# Patient Record
Sex: Male | Born: 1960 | Race: Black or African American | Hispanic: No | Marital: Single | Smoking: Current every day smoker
Health system: Southern US, Community
[De-identification: ages and names within clinical notes are randomized; demographics above are authoritative.]

## PROBLEM LIST (undated history)

## (undated) DIAGNOSIS — M1990 Unspecified osteoarthritis, unspecified site: Secondary | ICD-10-CM

## (undated) DIAGNOSIS — K219 Gastro-esophageal reflux disease without esophagitis: Secondary | ICD-10-CM

## (undated) DIAGNOSIS — F1010 Alcohol abuse, uncomplicated: Secondary | ICD-10-CM

## (undated) DIAGNOSIS — I10 Essential (primary) hypertension: Secondary | ICD-10-CM

## (undated) HISTORY — DX: Essential (primary) hypertension: I10

## (undated) HISTORY — DX: Alcohol abuse, uncomplicated: F10.10

## (undated) HISTORY — PX: BACK SURGERY: SHX140

## (undated) HISTORY — PX: HIP FRACTURE SURGERY: SHX118

## (undated) HISTORY — PX: SKIN GRAFT: SHX250

## (undated) HISTORY — DX: Gastro-esophageal reflux disease without esophagitis: K21.9

## (undated) HISTORY — DX: Unspecified osteoarthritis, unspecified site: M19.90

---

## 2004-05-19 ENCOUNTER — Emergency Department: Payer: Self-pay | Admitting: Emergency Medicine

## 2005-08-19 ENCOUNTER — Emergency Department: Payer: Self-pay | Admitting: Unknown Physician Specialty

## 2009-06-20 DIAGNOSIS — S72009A Fracture of unspecified part of neck of unspecified femur, initial encounter for closed fracture: Secondary | ICD-10-CM | POA: Insufficient documentation

## 2010-04-17 ENCOUNTER — Emergency Department: Payer: Self-pay | Admitting: Emergency Medicine

## 2011-02-02 ENCOUNTER — Emergency Department: Payer: Self-pay | Admitting: Emergency Medicine

## 2011-03-08 ENCOUNTER — Emergency Department: Payer: Self-pay | Admitting: Unknown Physician Specialty

## 2011-10-16 ENCOUNTER — Emergency Department: Payer: Self-pay | Admitting: Emergency Medicine

## 2011-12-15 ENCOUNTER — Emergency Department: Payer: Self-pay | Admitting: Emergency Medicine

## 2011-12-15 DIAGNOSIS — F17200 Nicotine dependence, unspecified, uncomplicated: Secondary | ICD-10-CM | POA: Diagnosis not present

## 2011-12-15 DIAGNOSIS — Z79899 Other long term (current) drug therapy: Secondary | ICD-10-CM | POA: Diagnosis not present

## 2011-12-15 DIAGNOSIS — L298 Other pruritus: Secondary | ICD-10-CM | POA: Diagnosis not present

## 2011-12-15 DIAGNOSIS — Z8614 Personal history of Methicillin resistant Staphylococcus aureus infection: Secondary | ICD-10-CM | POA: Diagnosis not present

## 2011-12-15 DIAGNOSIS — L290 Pruritus ani: Secondary | ICD-10-CM | POA: Diagnosis not present

## 2012-03-08 ENCOUNTER — Emergency Department: Payer: Self-pay | Admitting: Emergency Medicine

## 2012-03-08 DIAGNOSIS — L02219 Cutaneous abscess of trunk, unspecified: Secondary | ICD-10-CM | POA: Diagnosis not present

## 2012-03-08 DIAGNOSIS — Z79899 Other long term (current) drug therapy: Secondary | ICD-10-CM | POA: Diagnosis not present

## 2012-03-08 DIAGNOSIS — L02419 Cutaneous abscess of limb, unspecified: Secondary | ICD-10-CM | POA: Diagnosis not present

## 2012-03-08 DIAGNOSIS — K219 Gastro-esophageal reflux disease without esophagitis: Secondary | ICD-10-CM | POA: Diagnosis not present

## 2012-03-08 DIAGNOSIS — L03119 Cellulitis of unspecified part of limb: Secondary | ICD-10-CM | POA: Diagnosis not present

## 2012-03-08 DIAGNOSIS — Z8614 Personal history of Methicillin resistant Staphylococcus aureus infection: Secondary | ICD-10-CM | POA: Diagnosis not present

## 2012-03-10 ENCOUNTER — Emergency Department: Payer: Self-pay | Admitting: Internal Medicine

## 2012-03-10 DIAGNOSIS — Z79899 Other long term (current) drug therapy: Secondary | ICD-10-CM | POA: Diagnosis not present

## 2012-03-10 DIAGNOSIS — Z8614 Personal history of Methicillin resistant Staphylococcus aureus infection: Secondary | ICD-10-CM | POA: Diagnosis not present

## 2012-03-10 DIAGNOSIS — Z09 Encounter for follow-up examination after completed treatment for conditions other than malignant neoplasm: Secondary | ICD-10-CM | POA: Diagnosis not present

## 2012-03-11 DIAGNOSIS — M129 Arthropathy, unspecified: Secondary | ICD-10-CM | POA: Diagnosis not present

## 2012-03-11 DIAGNOSIS — J4489 Other specified chronic obstructive pulmonary disease: Secondary | ICD-10-CM | POA: Diagnosis not present

## 2012-03-11 DIAGNOSIS — R5383 Other fatigue: Secondary | ICD-10-CM | POA: Diagnosis not present

## 2012-03-11 DIAGNOSIS — E7800 Pure hypercholesterolemia, unspecified: Secondary | ICD-10-CM | POA: Diagnosis not present

## 2012-03-11 DIAGNOSIS — R109 Unspecified abdominal pain: Secondary | ICD-10-CM | POA: Diagnosis not present

## 2012-03-11 DIAGNOSIS — J449 Chronic obstructive pulmonary disease, unspecified: Secondary | ICD-10-CM | POA: Diagnosis not present

## 2012-03-11 DIAGNOSIS — R5381 Other malaise: Secondary | ICD-10-CM | POA: Diagnosis not present

## 2012-03-12 LAB — WOUND CULTURE

## 2012-03-17 DIAGNOSIS — F339 Major depressive disorder, recurrent, unspecified: Secondary | ICD-10-CM | POA: Diagnosis not present

## 2012-03-17 DIAGNOSIS — S069X9A Unspecified intracranial injury with loss of consciousness of unspecified duration, initial encounter: Secondary | ICD-10-CM | POA: Diagnosis not present

## 2012-03-17 DIAGNOSIS — M545 Low back pain: Secondary | ICD-10-CM | POA: Diagnosis not present

## 2012-03-17 DIAGNOSIS — R269 Unspecified abnormalities of gait and mobility: Secondary | ICD-10-CM | POA: Diagnosis not present

## 2012-03-24 DIAGNOSIS — L0233 Carbuncle of buttock: Secondary | ICD-10-CM | POA: Diagnosis not present

## 2012-06-04 DIAGNOSIS — M545 Low back pain: Secondary | ICD-10-CM | POA: Diagnosis not present

## 2012-06-04 DIAGNOSIS — IMO0001 Reserved for inherently not codable concepts without codable children: Secondary | ICD-10-CM | POA: Diagnosis not present

## 2012-07-21 DIAGNOSIS — M62838 Other muscle spasm: Secondary | ICD-10-CM | POA: Diagnosis not present

## 2012-07-21 DIAGNOSIS — M549 Dorsalgia, unspecified: Secondary | ICD-10-CM | POA: Diagnosis not present

## 2012-08-31 DIAGNOSIS — M545 Low back pain: Secondary | ICD-10-CM | POA: Diagnosis not present

## 2012-08-31 DIAGNOSIS — Z5189 Encounter for other specified aftercare: Secondary | ICD-10-CM | POA: Diagnosis not present

## 2012-08-31 DIAGNOSIS — F329 Major depressive disorder, single episode, unspecified: Secondary | ICD-10-CM | POA: Diagnosis not present

## 2012-09-13 DIAGNOSIS — Z79899 Other long term (current) drug therapy: Secondary | ICD-10-CM | POA: Diagnosis not present

## 2012-09-13 DIAGNOSIS — J449 Chronic obstructive pulmonary disease, unspecified: Secondary | ICD-10-CM | POA: Diagnosis not present

## 2012-09-13 DIAGNOSIS — R5383 Other fatigue: Secondary | ICD-10-CM | POA: Diagnosis not present

## 2012-09-13 DIAGNOSIS — R5381 Other malaise: Secondary | ICD-10-CM | POA: Diagnosis not present

## 2012-09-13 DIAGNOSIS — F329 Major depressive disorder, single episode, unspecified: Secondary | ICD-10-CM | POA: Diagnosis not present

## 2012-09-13 DIAGNOSIS — E7800 Pure hypercholesterolemia, unspecified: Secondary | ICD-10-CM | POA: Diagnosis not present

## 2012-09-13 DIAGNOSIS — Z0000 Encounter for general adult medical examination without abnormal findings: Secondary | ICD-10-CM | POA: Diagnosis not present

## 2012-09-13 DIAGNOSIS — E559 Vitamin D deficiency, unspecified: Secondary | ICD-10-CM | POA: Diagnosis not present

## 2012-10-11 DIAGNOSIS — F17200 Nicotine dependence, unspecified, uncomplicated: Secondary | ICD-10-CM | POA: Diagnosis not present

## 2012-10-11 DIAGNOSIS — L0291 Cutaneous abscess, unspecified: Secondary | ICD-10-CM | POA: Diagnosis not present

## 2012-10-11 DIAGNOSIS — L0390 Cellulitis, unspecified: Secondary | ICD-10-CM | POA: Diagnosis not present

## 2012-10-11 DIAGNOSIS — F411 Generalized anxiety disorder: Secondary | ICD-10-CM | POA: Diagnosis not present

## 2012-10-11 DIAGNOSIS — F329 Major depressive disorder, single episode, unspecified: Secondary | ICD-10-CM | POA: Diagnosis not present

## 2012-10-11 DIAGNOSIS — J449 Chronic obstructive pulmonary disease, unspecified: Secondary | ICD-10-CM | POA: Diagnosis not present

## 2012-12-17 DIAGNOSIS — J449 Chronic obstructive pulmonary disease, unspecified: Secondary | ICD-10-CM | POA: Diagnosis not present

## 2012-12-17 DIAGNOSIS — M545 Low back pain: Secondary | ICD-10-CM | POA: Diagnosis not present

## 2012-12-17 DIAGNOSIS — R5383 Other fatigue: Secondary | ICD-10-CM | POA: Diagnosis not present

## 2012-12-17 DIAGNOSIS — Z79899 Other long term (current) drug therapy: Secondary | ICD-10-CM | POA: Diagnosis not present

## 2012-12-17 DIAGNOSIS — F411 Generalized anxiety disorder: Secondary | ICD-10-CM | POA: Diagnosis not present

## 2012-12-17 DIAGNOSIS — F329 Major depressive disorder, single episode, unspecified: Secondary | ICD-10-CM | POA: Diagnosis not present

## 2012-12-17 DIAGNOSIS — E7800 Pure hypercholesterolemia, unspecified: Secondary | ICD-10-CM | POA: Diagnosis not present

## 2012-12-17 DIAGNOSIS — E559 Vitamin D deficiency, unspecified: Secondary | ICD-10-CM | POA: Diagnosis not present

## 2012-12-17 DIAGNOSIS — F17200 Nicotine dependence, unspecified, uncomplicated: Secondary | ICD-10-CM | POA: Diagnosis not present

## 2012-12-29 DIAGNOSIS — Z5189 Encounter for other specified aftercare: Secondary | ICD-10-CM | POA: Diagnosis not present

## 2012-12-29 DIAGNOSIS — M545 Low back pain: Secondary | ICD-10-CM | POA: Diagnosis not present

## 2013-02-19 ENCOUNTER — Emergency Department: Payer: Self-pay | Admitting: Emergency Medicine

## 2013-02-19 LAB — DRUG SCREEN, URINE
Amphetamines, Ur Screen: NEGATIVE (ref ?–1000)
Barbiturates, Ur Screen: NEGATIVE (ref ?–200)
Cannabinoid 50 Ng, Ur ~~LOC~~: POSITIVE (ref ?–50)
Cocaine Metabolite,Ur ~~LOC~~: POSITIVE (ref ?–300)
MDMA (Ecstasy)Ur Screen: NEGATIVE (ref ?–500)
Opiate, Ur Screen: NEGATIVE (ref ?–300)

## 2013-02-19 LAB — CBC
HGB: 15.3 g/dL (ref 13.0–18.0)
MCHC: 34.1 g/dL (ref 32.0–36.0)
MCV: 96 fL (ref 80–100)
Platelet: 324 10*3/uL (ref 150–440)
RBC: 4.66 10*6/uL (ref 4.40–5.90)
RDW: 14.3 % (ref 11.5–14.5)
WBC: 12.1 10*3/uL — ABNORMAL HIGH (ref 3.8–10.6)

## 2013-02-19 LAB — COMPREHENSIVE METABOLIC PANEL
Albumin: 4.6 g/dL (ref 3.4–5.0)
Anion Gap: 10 (ref 7–16)
BUN: 13 mg/dL (ref 7–18)
Bilirubin,Total: 0.6 mg/dL (ref 0.2–1.0)
Calcium, Total: 9.9 mg/dL (ref 8.5–10.1)
Chloride: 106 mmol/L (ref 98–107)
EGFR (Non-African Amer.): >60
Glucose: 119 mg/dL — ABNORMAL HIGH (ref 65–99)
SGOT(AST): 47 U/L — ABNORMAL HIGH (ref 15–37)
Sodium: 139 mmol/L (ref 136–145)

## 2013-02-19 LAB — SALICYLATE LEVEL: Salicylates, Serum: 3.4 mg/dL — ABNORMAL HIGH

## 2013-02-19 LAB — TSH: Thyroid Stimulating Horm: 0.95 u[IU]/mL

## 2013-02-19 LAB — ETHANOL
Ethanol %: <0.003 % (ref 0.000–0.080)
Ethanol: <3 mg/dL

## 2013-02-19 LAB — TROPONIN I: Troponin-I: <0.02 ng/mL

## 2013-03-25 DIAGNOSIS — F17200 Nicotine dependence, unspecified, uncomplicated: Secondary | ICD-10-CM | POA: Diagnosis not present

## 2013-03-25 DIAGNOSIS — L0291 Cutaneous abscess, unspecified: Secondary | ICD-10-CM | POA: Diagnosis not present

## 2013-03-25 DIAGNOSIS — E7800 Pure hypercholesterolemia, unspecified: Secondary | ICD-10-CM | POA: Diagnosis not present

## 2013-03-25 DIAGNOSIS — R5381 Other malaise: Secondary | ICD-10-CM | POA: Diagnosis not present

## 2013-03-25 DIAGNOSIS — M545 Low back pain, unspecified: Secondary | ICD-10-CM | POA: Diagnosis not present

## 2013-03-25 DIAGNOSIS — R109 Unspecified abdominal pain: Secondary | ICD-10-CM | POA: Diagnosis not present

## 2013-03-25 DIAGNOSIS — F411 Generalized anxiety disorder: Secondary | ICD-10-CM | POA: Diagnosis not present

## 2013-03-25 DIAGNOSIS — Z79899 Other long term (current) drug therapy: Secondary | ICD-10-CM | POA: Diagnosis not present

## 2013-03-25 DIAGNOSIS — E559 Vitamin D deficiency, unspecified: Secondary | ICD-10-CM | POA: Diagnosis not present

## 2013-03-25 DIAGNOSIS — G541 Lumbosacral plexus disorders: Secondary | ICD-10-CM | POA: Diagnosis not present

## 2013-03-25 DIAGNOSIS — J449 Chronic obstructive pulmonary disease, unspecified: Secondary | ICD-10-CM | POA: Diagnosis not present

## 2013-04-06 ENCOUNTER — Emergency Department: Payer: Self-pay | Admitting: Emergency Medicine

## 2013-04-06 DIAGNOSIS — F17200 Nicotine dependence, unspecified, uncomplicated: Secondary | ICD-10-CM | POA: Diagnosis not present

## 2013-04-06 DIAGNOSIS — Z8614 Personal history of Methicillin resistant Staphylococcus aureus infection: Secondary | ICD-10-CM | POA: Diagnosis not present

## 2013-04-06 DIAGNOSIS — L0201 Cutaneous abscess of face: Secondary | ICD-10-CM | POA: Diagnosis not present

## 2013-04-06 DIAGNOSIS — Z888 Allergy status to other drugs, medicaments and biological substances status: Secondary | ICD-10-CM | POA: Diagnosis not present

## 2013-05-27 DIAGNOSIS — R1013 Epigastric pain: Secondary | ICD-10-CM | POA: Diagnosis not present

## 2013-05-27 DIAGNOSIS — R110 Nausea: Secondary | ICD-10-CM | POA: Diagnosis not present

## 2013-05-27 DIAGNOSIS — Z1211 Encounter for screening for malignant neoplasm of colon: Secondary | ICD-10-CM | POA: Diagnosis not present

## 2013-06-16 ENCOUNTER — Ambulatory Visit: Payer: Self-pay | Admitting: Gastroenterology

## 2013-06-16 DIAGNOSIS — Z1211 Encounter for screening for malignant neoplasm of colon: Secondary | ICD-10-CM | POA: Diagnosis not present

## 2013-06-16 DIAGNOSIS — K648 Other hemorrhoids: Secondary | ICD-10-CM | POA: Diagnosis not present

## 2013-06-16 DIAGNOSIS — A048 Other specified bacterial intestinal infections: Secondary | ICD-10-CM | POA: Diagnosis not present

## 2013-06-16 DIAGNOSIS — R109 Unspecified abdominal pain: Secondary | ICD-10-CM | POA: Diagnosis not present

## 2013-06-16 DIAGNOSIS — K269 Duodenal ulcer, unspecified as acute or chronic, without hemorrhage or perforation: Secondary | ICD-10-CM | POA: Diagnosis not present

## 2013-06-16 DIAGNOSIS — D126 Benign neoplasm of colon, unspecified: Secondary | ICD-10-CM | POA: Diagnosis not present

## 2013-06-16 DIAGNOSIS — K5730 Diverticulosis of large intestine without perforation or abscess without bleeding: Secondary | ICD-10-CM | POA: Diagnosis not present

## 2013-06-16 DIAGNOSIS — K2900 Acute gastritis without bleeding: Secondary | ICD-10-CM | POA: Diagnosis not present

## 2013-06-17 LAB — PATHOLOGY REPORT

## 2014-02-21 DIAGNOSIS — S069X2S Unspecified intracranial injury with loss of consciousness of 31 minutes to 59 minutes, sequela: Secondary | ICD-10-CM | POA: Diagnosis not present

## 2014-02-21 DIAGNOSIS — R52 Pain, unspecified: Secondary | ICD-10-CM | POA: Diagnosis not present

## 2014-02-21 DIAGNOSIS — M791 Myalgia: Secondary | ICD-10-CM | POA: Diagnosis not present

## 2014-02-21 DIAGNOSIS — M545 Low back pain: Secondary | ICD-10-CM | POA: Diagnosis not present

## 2014-04-25 ENCOUNTER — Encounter: Payer: Self-pay | Admitting: Rehabilitation

## 2014-04-25 DIAGNOSIS — R262 Difficulty in walking, not elsewhere classified: Secondary | ICD-10-CM | POA: Diagnosis not present

## 2014-04-25 DIAGNOSIS — M25562 Pain in left knee: Secondary | ICD-10-CM | POA: Diagnosis not present

## 2014-04-25 DIAGNOSIS — M545 Low back pain: Secondary | ICD-10-CM | POA: Diagnosis not present

## 2014-05-02 DIAGNOSIS — M545 Low back pain: Secondary | ICD-10-CM | POA: Diagnosis not present

## 2014-05-02 DIAGNOSIS — M25562 Pain in left knee: Secondary | ICD-10-CM | POA: Diagnosis not present

## 2014-05-02 DIAGNOSIS — R262 Difficulty in walking, not elsewhere classified: Secondary | ICD-10-CM | POA: Diagnosis not present

## 2014-05-05 ENCOUNTER — Encounter: Payer: Self-pay | Admitting: Rehabilitation

## 2014-05-05 DIAGNOSIS — R52 Pain, unspecified: Secondary | ICD-10-CM | POA: Diagnosis not present

## 2014-05-05 DIAGNOSIS — M545 Low back pain: Secondary | ICD-10-CM | POA: Diagnosis not present

## 2014-05-05 DIAGNOSIS — M7052 Other bursitis of knee, left knee: Secondary | ICD-10-CM | POA: Diagnosis not present

## 2014-06-05 ENCOUNTER — Encounter: Payer: Self-pay | Admitting: Rehabilitation

## 2014-07-27 DIAGNOSIS — I10 Essential (primary) hypertension: Secondary | ICD-10-CM | POA: Diagnosis not present

## 2014-07-27 DIAGNOSIS — M25552 Pain in left hip: Secondary | ICD-10-CM | POA: Diagnosis not present

## 2014-07-27 DIAGNOSIS — F331 Major depressive disorder, recurrent, moderate: Secondary | ICD-10-CM | POA: Diagnosis not present

## 2014-07-27 DIAGNOSIS — E782 Mixed hyperlipidemia: Secondary | ICD-10-CM | POA: Diagnosis not present

## 2014-07-27 DIAGNOSIS — E1165 Type 2 diabetes mellitus with hyperglycemia: Secondary | ICD-10-CM | POA: Diagnosis not present

## 2014-07-27 DIAGNOSIS — F17210 Nicotine dependence, cigarettes, uncomplicated: Secondary | ICD-10-CM | POA: Diagnosis not present

## 2014-12-29 ENCOUNTER — Emergency Department
Admission: EM | Admit: 2014-12-29 | Discharge: 2014-12-29 | Disposition: A | Discharge status: Home / Self Care | Payer: Medicare Other | Attending: Emergency Medicine | Admitting: Emergency Medicine

## 2014-12-29 ENCOUNTER — Encounter: Payer: Self-pay | Admitting: *Deleted

## 2014-12-29 DIAGNOSIS — T300 Burn of unspecified body region, unspecified degree: Secondary | ICD-10-CM

## 2014-12-29 DIAGNOSIS — Z23 Encounter for immunization: Secondary | ICD-10-CM | POA: Diagnosis not present

## 2014-12-29 DIAGNOSIS — Y93G9 Activity, other involving cooking and grilling: Secondary | ICD-10-CM | POA: Insufficient documentation

## 2014-12-29 DIAGNOSIS — T23262A Burn of second degree of back of left hand, initial encounter: Secondary | ICD-10-CM | POA: Diagnosis not present

## 2014-12-29 DIAGNOSIS — Y998 Other external cause status: Secondary | ICD-10-CM | POA: Diagnosis not present

## 2014-12-29 DIAGNOSIS — T2111XA Burn of first degree of chest wall, initial encounter: Secondary | ICD-10-CM | POA: Diagnosis not present

## 2014-12-29 DIAGNOSIS — T22112A Burn of first degree of left forearm, initial encounter: Secondary | ICD-10-CM | POA: Insufficient documentation

## 2014-12-29 DIAGNOSIS — Z720 Tobacco use: Secondary | ICD-10-CM | POA: Diagnosis not present

## 2014-12-29 DIAGNOSIS — T310 Burns involving less than 10% of body surface: Secondary | ICD-10-CM | POA: Diagnosis not present

## 2014-12-29 DIAGNOSIS — Y9289 Other specified places as the place of occurrence of the external cause: Secondary | ICD-10-CM | POA: Diagnosis not present

## 2014-12-29 DIAGNOSIS — T23002A Burn of unspecified degree of left hand, unspecified site, initial encounter: Secondary | ICD-10-CM | POA: Diagnosis not present

## 2014-12-29 DIAGNOSIS — X102XXA Contact with fats and cooking oils, initial encounter: Secondary | ICD-10-CM | POA: Diagnosis not present

## 2014-12-29 MED ORDER — SILVER SULFADIAZINE 1 % EX CREA
TOPICAL_CREAM | Freq: Once | CUTANEOUS | Status: AC
  Administered 2014-12-29: 04:00:00 via TOPICAL
  Filled 2014-12-29: qty 85

## 2014-12-29 MED ORDER — MORPHINE SULFATE (PF) 4 MG/ML IV SOLN
4.0000 mg | Freq: Once | INTRAVENOUS | Status: AC
  Administered 2014-12-29: 4 mg via INTRAMUSCULAR
  Filled 2014-12-29: qty 1

## 2014-12-29 MED ORDER — OXYCODONE-ACETAMINOPHEN 5-325 MG PO TABS: 1.0000 | ORAL_TABLET | ORAL | Status: DC | PRN

## 2014-12-29 MED ORDER — OXYCODONE-ACETAMINOPHEN 5-325 MG PO TABS
1.0000 | ORAL_TABLET | Freq: Once | ORAL | Status: AC
  Administered 2014-12-29: 1 via ORAL
  Filled 2014-12-29: qty 1

## 2014-12-29 MED ORDER — TETANUS-DIPHTH-ACELL PERTUSSIS 5-2.5-18.5 LF-MCG/0.5 IM SUSP
0.5000 mL | Freq: Once | INTRAMUSCULAR | Status: AC
  Administered 2014-12-29: 0.5 mL via INTRAMUSCULAR
  Filled 2014-12-29: qty 0.5

## 2014-12-29 MED ORDER — ONDANSETRON 4 MG PO TBDP
4.0000 mg | ORAL_TABLET | Freq: Once | ORAL | Status: AC
  Administered 2014-12-29: 4 mg via ORAL
  Filled 2014-12-29: qty 1
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
## 2014-12-29 NOTE — ED Notes (Signed)
MD at bedside for eval.
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
## 2015-01-04 DIAGNOSIS — T2230XA Burn of third degree of shoulder and upper limb, except wrist and hand, unspecified site, initial encounter: Secondary | ICD-10-CM | POA: Diagnosis not present

## 2015-01-04 DIAGNOSIS — R001 Bradycardia, unspecified: Secondary | ICD-10-CM | POA: Diagnosis not present

## 2015-01-04 DIAGNOSIS — Z01818 Encounter for other preprocedural examination: Secondary | ICD-10-CM | POA: Diagnosis not present

## 2015-01-05 DIAGNOSIS — T23392A Burn of third degree of multiple sites of left wrist and hand, initial encounter: Secondary | ICD-10-CM | POA: Diagnosis not present

## 2015-01-05 DIAGNOSIS — T22312A Burn of third degree of left forearm, initial encounter: Secondary | ICD-10-CM | POA: Diagnosis not present

## 2015-01-05 DIAGNOSIS — T310 Burns involving less than 10% of body surface: Secondary | ICD-10-CM | POA: Diagnosis not present

## 2015-01-06 DIAGNOSIS — T23392A Burn of third degree of multiple sites of left wrist and hand, initial encounter: Secondary | ICD-10-CM | POA: Diagnosis not present

## 2015-01-06 DIAGNOSIS — T22312A Burn of third degree of left forearm, initial encounter: Secondary | ICD-10-CM | POA: Diagnosis not present

## 2015-01-06 DIAGNOSIS — T310 Burns involving less than 10% of body surface: Secondary | ICD-10-CM | POA: Diagnosis not present

## 2015-01-09 DIAGNOSIS — T23362A Burn of third degree of back of left hand, initial encounter: Secondary | ICD-10-CM | POA: Diagnosis not present

## 2015-01-09 DIAGNOSIS — T22392A Burn of third degree of multiple sites of left shoulder and upper limb, except wrist and hand, initial encounter: Secondary | ICD-10-CM | POA: Diagnosis not present

## 2015-01-09 DIAGNOSIS — T23302A Burn of third degree of left hand, unspecified site, initial encounter: Secondary | ICD-10-CM | POA: Diagnosis not present

## 2015-01-09 DIAGNOSIS — T310 Burns involving less than 10% of body surface: Secondary | ICD-10-CM | POA: Diagnosis not present

## 2015-01-24 DIAGNOSIS — T2220XS Burn of second degree of shoulder and upper limb, except wrist and hand, unspecified site, sequela: Secondary | ICD-10-CM | POA: Diagnosis not present

## 2015-01-24 DIAGNOSIS — T2230XS Burn of third degree of shoulder and upper limb, except wrist and hand, unspecified site, sequela: Secondary | ICD-10-CM | POA: Diagnosis not present

## 2015-01-24 DIAGNOSIS — R6889 Other general symptoms and signs: Secondary | ICD-10-CM | POA: Diagnosis not present

## 2015-01-24 DIAGNOSIS — T310 Burns involving less than 10% of body surface: Secondary | ICD-10-CM | POA: Diagnosis not present

## 2015-01-24 DIAGNOSIS — T2230XA Burn of third degree of shoulder and upper limb, except wrist and hand, unspecified site, initial encounter: Secondary | ICD-10-CM | POA: Insufficient documentation

## 2015-02-15 ENCOUNTER — Encounter: Payer: Medicare Other | Attending: Surgery | Admitting: Surgery

## 2015-02-15 DIAGNOSIS — X58XXXA Exposure to other specified factors, initial encounter: Secondary | ICD-10-CM | POA: Diagnosis not present

## 2015-02-15 DIAGNOSIS — T22312A Burn of third degree of left forearm, initial encounter: Secondary | ICD-10-CM | POA: Diagnosis not present

## 2015-02-15 DIAGNOSIS — I10 Essential (primary) hypertension: Secondary | ICD-10-CM | POA: Insufficient documentation

## 2015-02-15 DIAGNOSIS — F1014 Alcohol abuse with alcohol-induced mood disorder: Secondary | ICD-10-CM | POA: Insufficient documentation

## 2015-02-15 DIAGNOSIS — F17218 Nicotine dependence, cigarettes, with other nicotine-induced disorders: Secondary | ICD-10-CM | POA: Insufficient documentation
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
## 2015-02-16 NOTE — Progress Notes (Addendum)
MATTHEU, BRODERSEN ([id number]) Visit Report for 02/15/2015 Allergy List Details Patient Name: Michael Patterson, Michael Patterson Date of Service: 02/15/2015 8:45 AM Medical Record Number: [id number] Patient Account Number: [id number] Date of Birth/Sex: 07-Feb-1961 (54 y.o. Male) Treating RN: Montey Hora Primary Care Physician: Other Clinician: Referring Physician: Treating Physician/Extender: Frann Rider in Treatment: 0 Allergies Active Allergies aspirin Allergy Notes Electronic Signature(s) Signed: 02/15/2015 4:23:35 PM By: Montey Hora Entered By: Montey Hora on 02/15/2015 09:03:22 Michael Patterson ([id number]) -------------------------------------------------------------------------------- Arrival Information Details Patient Name: Michael Patterson Date of Service: 02/15/2015 8:45 AM Medical Record Number: [id number] Patient Account Number: [id number] Date of Birth/Sex: Nov 01, 1960 (54 y.o. Male) Treating RN: Montey Hora Primary Care Physician: Other Clinician: Referring Physician: Treating Physician/Extender: Frann Rider in Treatment: 0 Visit Information Patient Arrived: [place] Pleasure Time: 08:56 Accompanied By: self Transfer Assistance: None Patient Identification Verified: Yes Secondary Verification Process Completed: Yes Electronic Signature(s) Signed: 02/15/2015 4:23:35 PM By: Montey Hora Entered By: Montey Hora on 02/15/2015 09:00:30 Michael Patterson ([id number]) -------------------------------------------------------------------------------- Clinic Level of Care Assessment Details Patient Name: Michael Patterson Date of Service: 02/15/2015 8:45 AM Medical Record Number: [id number] Patient Account Number: [id number] Date of Birth/Sex: 1960/05/18 (54 y.o. Male) Treating RN: Montey Hora Primary Care Physician: Other Clinician: Referring Physician: Treating Physician/Extender: Frann Rider in Treatment: 0 Clinic Level of Care  Assessment Items TOOL 2 Quantity Score []  - Use when only an EandM is performed on the INITIAL visit 0 ASSESSMENTS - Nursing Assessment / Reassessment X - General Physical Exam (combine w/ comprehensive assessment (listed just 1 20 below) when performed on new pt. evals) X - Comprehensive Assessment (HX, ROS, Risk Assessments, Wounds Hx, etc.) 1 25 ASSESSMENTS - Wound and Skin Assessment / Reassessment []  - Simple Wound Assessment / Reassessment - one wound 0 []  - Complex Wound Assessment / Reassessment - multiple wounds 0 X - Dermatologic / Skin Assessment (not related to wound area) 1 10 ASSESSMENTS - Ostomy and/or Continence Assessment and Care []  - Incontinence Assessment and Management 0 []  - Ostomy Care Assessment and Management (repouching, etc.) 0 PROCESS - Coordination of Care X - Simple Patient / Family Education for ongoing care 1 15 []  - Complex (extensive) Patient / Family Education for ongoing care 0 X - Staff obtains Programmer, systems, Records, Test Results / Process Orders 1 10 []  - Staff telephones HHA, Nursing Homes / Clarify orders / etc 0 []  - Routine Transfer to another Facility (non-emergent condition) 0 []  - Routine Hospital Admission (non-emergent condition) 0 X - New Admissions / Biomedical engineer / Ordering NPWT, Apligraf, etc. 1 15 []  - Emergency Hospital Admission (emergent condition) 0 X - Simple Discharge Coordination 1 10 Haecker, Naomi L. ([id number]) []  - Complex (extensive) Discharge Coordination 0 PROCESS - Special Needs []  - Pediatric / Minor Patient Management 0 []  - Isolation Patient Management 0 []  - Hearing / Language / Visual special needs 0 []  - Assessment of Community assistance (transportation, D/C planning, etc.) 0 []  - Additional assistance / Altered mentation 0 []  - Support Surface(s) Assessment (bed, cushion, seat, etc.) 0 INTERVENTIONS - Wound Cleansing / Measurement []  - Wound Imaging (photographs - any number of wounds) 0 []  - Wound  Tracing (instead of photographs) 0 []  - Simple Wound Measurement - one wound 0 []  - Complex Wound Measurement - multiple wounds 0 []  - Simple Wound Cleansing - one wound 0 []  - Complex Wound Cleansing - multiple wounds 0 INTERVENTIONS - Wound Dressings []  - Small Wound Dressing one  or multiple wounds 0 []  - Medium Wound Dressing one or multiple wounds 0 []  - Large Wound Dressing one or multiple wounds 0 []  - Application of Medications - injection 0 INTERVENTIONS - Miscellaneous []  - External ear exam 0 []  - Specimen Collection (cultures, biopsies, blood, body fluids, etc.) 0 []  - Specimen(s) / Culture(s) sent or taken to Lab for analysis 0 []  - Patient Transfer (multiple staff / Harrel Lemon Lift / Similar devices) 0 []  - Simple Staple / Suture removal (25 or less) 0 []  - Complex Staple / Suture removal (26 or more) 0 Canipe, Natanael L. ([id number]) []  - Hypo / Hyperglycemic Management (close monitor of Blood Glucose) 0 []  - Ankle / Brachial Index (ABI) - do not check if billed separately 0 Has the patient been seen at the hospital within the last three years: Yes Total Score: 105 Level Of Care: New/Established - Level 3 Electronic Signature(s) Signed: 02/15/2015 9:36:45 AM By: Montey Hora Entered By: Montey Hora on 02/15/2015 09:36:44 Michael Patterson ([id number]) -------------------------------------------------------------------------------- Encounter Discharge Information Details Patient Name: Michael Patterson Date of Service: 02/15/2015 8:45 AM Medical Record Number: [id number] Patient Account Number: [id number] Date of Birth/Sex: [birth year] (54 y.o. Male) Treating RN: Montey Hora Primary Care Physician: Other Clinician: Referring Physician: Treating Physician/Extender: Frann Rider in Treatment: 0 Encounter Discharge Information Items Discharge Pain Level: 0 Discharge Condition: Stable Ambulatory Status: Cane Discharge Destination:  Home Private Transportation: Auto Accompanied By: self Schedule Follow-up Appointment: Yes Medication Reconciliation completed and No provided to Patient/Care Maylene Crocker: Clinical Summary of Care: Electronic Signature(s) Signed: 02/15/2015 9:37:11 AM By: Montey Hora Entered By: Montey Hora on 02/15/2015 09:37:11 Michael Patterson ([id number]) -------------------------------------------------------------------------------- Multi Wound Chart Details Patient Name: Michael Patterson Date of Service: 02/15/2015 8:45 AM Medical Record Number: [id number] Patient Account Number: [id number] Date of Birth/Sex: 1960/08/09 (54 y.o. Male) Treating RN: Montey Hora Primary Care Physician: Other Clinician: Referring Physician: Treating Physician/Extender: Frann Rider in Treatment: 0 Vital Signs Height(in): 73 Pulse(bpm): 79 Weight(lbs): 162 Blood Pressure 156/95 (mmHg): Body Mass Index(BMI): 21 Temperature(F): 98.4 Respiratory Rate 18 (breaths/min): Wound Assessments Treatment Notes Electronic Signature(s) Signed: 02/15/2015 9:36:08 AM By: Montey Hora Entered By: Montey Hora on 02/15/2015 09:36:08 Michael Patterson ([id number]) -------------------------------------------------------------------------------- [place] Details Patient Name: Michael Patterson Date of Service: 02/15/2015 8:45 AM Medical Record Number: [id number] Patient Account Number: [id number] Date of Birth/Sex: 1960/11/25 (53 y.o. Male) Treating RN: Montey Hora Primary Care Physician: Other Clinician: Referring Physician: Treating Physician/Extender: Frann Rider in Treatment: 0 Active Inactive Electronic Signature(s) Signed: 03/13/2015 5:08:46 PM By: Montey Hora Previous Signature: 02/15/2015 9:35:58 AM Version By: Montey Hora Previous Signature: 02/15/2015 9:33:27 AM Version By: Montey Hora Previous Signature: 02/15/2015 9:33:09 AM Version By:  Montey Hora Entered By: Montey Hora on 03/13/2015 17:08:46 Michael Patterson ([id number]) -------------------------------------------------------------------------------- Patient/Caregiver Education Details Patient Name: Michael Patterson Date of Service: 02/15/2015 8:45 AM Medical Record Number: [id number] Patient Account Number: [id number] Date of Birth/Gender: Jan 08, 1961 (54 y.o. Male) Treating RN: Montey Hora Primary Care Physician: Other Clinician: Referring Physician: Treating Physician/Extender: Frann Rider in Treatment: 0 Education Assessment Education Provided To: Patient Education Topics Provided Nutrition: Handouts: Other: ETOH and increased protein Methods: Explain/Verbal Responses: State content correctly Wound/Skin Impairment: Handouts: Other: skin care of healed burn and graft site, hand ROM exercises Methods: Demonstration, Explain/Verbal Responses: State content correctly Electronic Signature(s) Signed: 02/15/2015 9:38:17 AM By: Montey Hora Entered By: Montey Hora on 02/15/2015 09:38:16 Kennard, Watson Carlean Jews ([id number]) -------------------------------------------------------------------------------- [place] Details Patient Name:  Michael Patterson Date of Service: 02/15/2015 8:45 AM Medical Record Number: [id number] Patient Account Number: [id number] Date of Birth/Sex: [birth year] (54 y.o. Male) Treating RN: Montey Hora Primary Care Physician: Other Clinician: Referring Physician: Treating Physician/Extender: Frann Rider in Treatment: 0 Vital Signs Time Taken: 09:00 Temperature (F): 98.4 Height (in): 73 Pulse (bpm): 79 Source: Stated Respiratory Rate (breaths/min): 18 Weight (lbs): 162 Blood Pressure (mmHg): 156/95 Source: Stated Reference Range: 80 - 120 mg / dl Body Mass Index (BMI): 21.4 Electronic Signature(s) Signed: 02/15/2015 4:23:35 PM By: Montey Hora Entered By: Montey Hora on 02/15/2015  09:02:26

## 2015-02-22 ENCOUNTER — Ambulatory Visit: Payer: Medicare Other | Admitting: Surgery

## 2015-03-27 DIAGNOSIS — Z0001 Encounter for general adult medical examination with abnormal findings: Secondary | ICD-10-CM | POA: Diagnosis not present

## 2015-04-16 DIAGNOSIS — F332 Major depressive disorder, recurrent severe without psychotic features: Secondary | ICD-10-CM | POA: Diagnosis not present

## 2015-04-19 DIAGNOSIS — R801 Persistent proteinuria, unspecified: Secondary | ICD-10-CM | POA: Diagnosis not present

## 2015-05-09 DIAGNOSIS — F332 Major depressive disorder, recurrent severe without psychotic features: Secondary | ICD-10-CM | POA: Diagnosis not present

## 2015-05-16 ENCOUNTER — Ambulatory Visit: Payer: Medicare Other | Attending: Anesthesiology | Admitting: Anesthesiology

## 2015-05-16 ENCOUNTER — Encounter: Payer: Self-pay | Admitting: Anesthesiology

## 2015-05-16 ENCOUNTER — Other Ambulatory Visit: Payer: Self-pay | Admitting: Anesthesiology

## 2015-05-16 VITALS — BP 117/78 | HR 78 | Temp 98.4°F | Resp 18 | Ht 73.0 in | Wt 164.0 lb

## 2015-05-16 DIAGNOSIS — M12852 Other specific arthropathies, not elsewhere classified, left hip: Secondary | ICD-10-CM | POA: Diagnosis not present

## 2015-05-16 DIAGNOSIS — M5137 Other intervertebral disc degeneration, lumbosacral region: Secondary | ICD-10-CM | POA: Diagnosis not present

## 2015-05-16 DIAGNOSIS — Z9889 Other specified postprocedural states: Secondary | ICD-10-CM | POA: Diagnosis not present

## 2015-05-16 DIAGNOSIS — M5116 Intervertebral disc disorders with radiculopathy, lumbar region: Secondary | ICD-10-CM | POA: Diagnosis not present

## 2015-05-16 DIAGNOSIS — F1490 Cocaine use, unspecified, uncomplicated: Secondary | ICD-10-CM | POA: Insufficient documentation

## 2015-05-16 DIAGNOSIS — M5442 Lumbago with sciatica, left side: Secondary | ICD-10-CM | POA: Diagnosis not present

## 2015-05-16 DIAGNOSIS — G8929 Other chronic pain: Secondary | ICD-10-CM | POA: Insufficient documentation

## 2015-05-16 DIAGNOSIS — R202 Paresthesia of skin: Secondary | ICD-10-CM | POA: Insufficient documentation

## 2015-05-16 DIAGNOSIS — M545 Low back pain, unspecified: Secondary | ICD-10-CM | POA: Insufficient documentation

## 2015-05-16 DIAGNOSIS — I10 Essential (primary) hypertension: Secondary | ICD-10-CM | POA: Insufficient documentation

## 2015-05-16 DIAGNOSIS — M1612 Unilateral primary osteoarthritis, left hip: Secondary | ICD-10-CM | POA: Insufficient documentation

## 2015-05-16 DIAGNOSIS — F1020 Alcohol dependence, uncomplicated: Secondary | ICD-10-CM | POA: Diagnosis not present

## 2015-05-16 DIAGNOSIS — F17210 Nicotine dependence, cigarettes, uncomplicated: Secondary | ICD-10-CM | POA: Diagnosis not present

## 2015-05-16 DIAGNOSIS — M5417 Radiculopathy, lumbosacral region: Secondary | ICD-10-CM | POA: Diagnosis not present

## 2015-05-16 DIAGNOSIS — F1210 Cannabis abuse, uncomplicated: Secondary | ICD-10-CM | POA: Insufficient documentation

## 2015-05-16 DIAGNOSIS — M5416 Radiculopathy, lumbar region: Secondary | ICD-10-CM

## 2015-05-16 MED ORDER — OXYCODONE-ACETAMINOPHEN 5-325 MG PO TABS: 1.0000 | ORAL_TABLET | Freq: Two times a day (BID) | ORAL | Status: DC
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
## 2015-05-16 NOTE — Patient Instructions (Signed)
You were given a prescription for Oxycodone today. Epidural Steroid Injection Patient Information  Description: The epidural space surrounds the nerves as they exit the spinal cord.  In some patients, the nerves can be compressed and inflamed by a bulging disc or a tight spinal canal (spinal stenosis).  By injecting steroids into the epidural space, we can bring irritated nerves into direct contact with a potentially helpful medication.  These steroids act directly on the irritated nerves and can reduce swelling and inflammation which often leads to decreased pain.  Epidural steroids may be injected anywhere along the spine and from the neck to the low back depending upon the location of your pain.   After numbing the skin with local anesthetic (like Novocaine), a small needle is passed into the epidural space slowly.  You may experience a sensation of pressure while this is being done.  The entire block usually last less than 10 minutes.  Conditions which may be treated by epidural steroids:   Low back and leg pain  Neck and arm pain  Spinal stenosis  Post-laminectomy syndrome  Herpes zoster (shingles) pain  Pain from compression fractures  Preparation for the injection:  1. Do not eat any solid food or dairy products within 6 hours of your appointment.  2. You may drink clear liquids up to 2 hours before appointment.  Clear liquids include water, black coffee, juice or soda.  No milk or cream please. 3. You may take your regular medication, including pain medications, with a sip of water before your appointment  Diabetics should hold regular insulin (if taken separately) and take 1/2 normal NPH dos the morning of the procedure.  Carry some sugar containing items with you to your appointment. 4. A driver must accompany you and be prepared to drive you home after your procedure.  5. Bring all your current medications with your. 6. An IV may be inserted and sedation may be given at the  discretion of the physician.   7. A blood pressure cuff, EKG and other monitors will often be applied during the procedure.  Some patients may need to have extra oxygen administered for a short period. 8. You will be asked to provide medical information, including your allergies, prior to the procedure.  We must know immediately if you are taking blood thinners (like Coumadin/Warfarin)  Or if you are allergic to IV iodine contrast (dye). We must know if you could possible be pregnant.  Possible side-effects:  Bleeding from needle site  Infection (rare, may require surgery)  Nerve injury (rare)  Numbness & tingling (temporary)  Difficulty urinating (rare, temporary)  Spinal headache ( a headache worse with upright posture)  Light -headedness (temporary)  Pain at injection site (several days)  Decreased blood pressure (temporary)  Weakness in arm/leg (temporary)  Pressure sensation in back/neck (temporary)  Call if you experience:  Fever/chills associated with headache or increased back/neck pain.  Headache worsened by an upright position.  New onset weakness or numbness of an extremity below the injection site  Hives or difficulty breathing (go to the emergency room)  Inflammation or drainage at the infection site  Severe back/neck pain  Any new symptoms which are concerning to you  Please note:  Although the local anesthetic injected can often make your back or neck feel good for several hours after the injection, the pain will likely return.  It takes 3-7 days for steroids to work in the epidural space.  You may not notice any pain   relief for at least that one week.  If effective, we will often do a series of three injections spaced 3-6 weeks apart to maximally decrease your pain.  After the initial series, we generally will wait several months before considering a repeat injection of the same type.  If you have any questions, please call [phone] [facility]

## 2015-05-24 LAB — TOXASSURE SELECT 13 (MW), URINE: PDF: 0

## 2015-06-12 ENCOUNTER — Ambulatory Visit: Payer: Medicare Other | Admitting: Anesthesiology

## 2015-06-13 ENCOUNTER — Ambulatory Visit: Payer: Medicare Other | Attending: Anesthesiology | Admitting: Anesthesiology

## 2015-06-13 ENCOUNTER — Encounter: Payer: Self-pay | Admitting: Anesthesiology

## 2015-06-13 VITALS — BP 155/86 | HR 53 | Temp 98.7°F | Resp 15

## 2015-06-13 DIAGNOSIS — M5116 Intervertebral disc disorders with radiculopathy, lumbar region: Secondary | ICD-10-CM | POA: Insufficient documentation

## 2015-06-13 DIAGNOSIS — M5416 Radiculopathy, lumbar region: Secondary | ICD-10-CM

## 2015-06-13 DIAGNOSIS — M5137 Other intervertebral disc degeneration, lumbosacral region: Secondary | ICD-10-CM | POA: Diagnosis not present

## 2015-06-13 DIAGNOSIS — G8929 Other chronic pain: Secondary | ICD-10-CM | POA: Insufficient documentation

## 2015-06-13 DIAGNOSIS — M545 Low back pain, unspecified: Secondary | ICD-10-CM

## 2015-06-13 DIAGNOSIS — M5442 Lumbago with sciatica, left side: Secondary | ICD-10-CM | POA: Diagnosis not present

## 2015-06-13 DIAGNOSIS — M5417 Radiculopathy, lumbosacral region: Secondary | ICD-10-CM | POA: Diagnosis not present

## 2015-06-13 DIAGNOSIS — M129 Arthropathy, unspecified: Secondary | ICD-10-CM | POA: Insufficient documentation

## 2015-06-13 DIAGNOSIS — M1612 Unilateral primary osteoarthritis, left hip: Secondary | ICD-10-CM

## 2015-06-13 MED ORDER — BUPIVACAINE HCL (PF) 0.25 % IJ SOLN
INTRAMUSCULAR | Status: AC
  Administered 2015-06-13: 16:00:00
  Filled 2015-06-13: qty 30

## 2015-06-13 MED ORDER — BUPIVACAINE HCL (PF) 0.25 % IJ SOLN: 20.0000 mL | Freq: Once | INTRAMUSCULAR | Status: DC

## 2015-06-13 MED ORDER — IOHEXOL 180 MG/ML  SOLN
INTRAMUSCULAR | Status: AC
  Administered 2015-06-13: 16:00:00
  Filled 2015-06-13: qty 20

## 2015-06-13 MED ORDER — FENTANYL CITRATE (PF) 100 MCG/2ML IJ SOLN
INTRAMUSCULAR | Status: AC
  Administered 2015-06-13: 50 ug via INTRAVENOUS
  Filled 2015-06-13: qty 2

## 2015-06-13 MED ORDER — TRIAMCINOLONE ACETONIDE 40 MG/ML IJ SUSP
INTRAMUSCULAR | Status: AC
  Administered 2015-06-13: 16:00:00
  Filled 2015-06-13: qty 2

## 2015-06-13 MED ORDER — IOHEXOL 180 MG/ML  SOLN: 20.0000 mL | INTRAMUSCULAR | Status: DC | PRN

## 2015-06-13 MED ORDER — TRIAMCINOLONE ACETONIDE 40 MG/ML IJ SUSP: 80.0000 mg | Freq: Once | INTRAMUSCULAR | Status: DC

## 2015-06-13 MED ORDER — MIDAZOLAM HCL 5 MG/5ML IJ SOLN: 1.0000 mg | INTRAMUSCULAR | Status: DC

## 2015-06-13 MED ORDER — OXYCODONE-ACETAMINOPHEN 5-325 MG PO TABS: 1.0000 | ORAL_TABLET | Freq: Two times a day (BID) | ORAL | Status: DC

## 2015-06-13 MED ORDER — MIDAZOLAM HCL 5 MG/5ML IJ SOLN
INTRAMUSCULAR | Status: AC
  Administered 2015-06-13: 2 mg via INTRAVENOUS
  Filled 2015-06-13: qty 5

## 2015-06-13 MED ORDER — FENTANYL CITRATE (PF) 100 MCG/2ML IJ SOLN: 50.0000 ug | INTRAMUSCULAR | Status: DC

## 2015-06-13 NOTE — Patient Instructions (Signed)

## 2015-06-13 NOTE — Procedures (Signed)
Date of procedure: 06/13/2015  Preoperative Diagnosis:  1 chronic low back pain 2 lumbar degenerative disc disease 3 lumbar radiculopathy  Postoperative Diagnosis:  Same.  Procedure: 1. Caudal epidural steroid injection, 2. Epidural with interpretation. 3. Fluoroscopic guidance.  Surgeon: Lance Bosch, MD  Anesthesia: MAC anesthesia by the nursing staff under my direction  Informed consent was obtained and the patient appeared to accept and understand the benefits and risks of this procedure.   Pre procedure comments:  None  Description of the Procedure:  The patient was taken to the operating room and placed in the prone position.  Intravenous sedation and MAC anesthesia was administered by the nurse and staff under my direction. After appropriate sedation, the sacrococcygeal area was prepped with Betadine.  After adequate draping, the area between the sacral cornu was palpated and infiltrated with 3 cc of 1% Lidocaine.   An AP fluoroscopic view of the sacrum was visualized and a 17 gauge Tuohy needle was inserted in the midline at the angle of 45 degrees through the sacrococcygeal membrane.  After making contact with the bone, the needle was withdrawn and readvanced in horizontal position, into the caudal epidural space.  Epidurogram Study: One cc of Omnipaque 180 was injected through the needle and epidurogram was visualized in both the later and AP views.  Comments:   No catheter was used  Caudal Epidural Steroid Injection:  Then 10 cc of 0.25% Bupivacaine and 25 mg Kenalog of  were injected into the Caudal epidural space.   The needle was removed and adequate hemostasis was established.    The patient tolerated the procedure quite well and vital signs were stable.   There were no adverse effects.  Additional comments:  Procedure was done using fluoroscopic guidance Fluoroscopic time was 0.1 minutes Number of fluoroscopic frames was to [facility] was 1.9  The patient  was taken to the recovery room in satisfactory condition where the patient was observed and subsequently discharged home.   Will follow up in the clinic in the next week.   Lance Bosch M.D.

## 2015-06-13 NOTE — Progress Notes (Signed)
Safety precautions to be maintained throughout the outpatient stay will include: orient to surroundings, keep bed in low position, maintain call bell within reach at all times, provide assistance with transfer out of bed and ambulation.  

## 2015-06-14 ENCOUNTER — Telehealth: Payer: Self-pay | Admitting: *Deleted

## 2015-06-14 NOTE — Telephone Encounter (Signed)
Left voicemail to call our office if they have questions or concerns re; procedure on yesterday.

## 2015-07-10 ENCOUNTER — Encounter: Payer: Self-pay | Admitting: Anesthesiology

## 2015-07-10 ENCOUNTER — Ambulatory Visit: Payer: Medicare Other | Attending: Anesthesiology | Admitting: Anesthesiology

## 2015-07-10 VITALS — BP 144/83 | HR 63 | Temp 98.1°F | Resp 15 | Ht 73.0 in | Wt 165.0 lb

## 2015-07-10 DIAGNOSIS — M545 Low back pain, unspecified: Secondary | ICD-10-CM

## 2015-07-10 DIAGNOSIS — M5137 Other intervertebral disc degeneration, lumbosacral region: Secondary | ICD-10-CM | POA: Diagnosis not present

## 2015-07-10 DIAGNOSIS — M5116 Intervertebral disc disorders with radiculopathy, lumbar region: Secondary | ICD-10-CM | POA: Diagnosis not present

## 2015-07-10 DIAGNOSIS — M5442 Lumbago with sciatica, left side: Secondary | ICD-10-CM | POA: Diagnosis not present

## 2015-07-10 DIAGNOSIS — M5417 Radiculopathy, lumbosacral region: Secondary | ICD-10-CM | POA: Diagnosis not present

## 2015-07-10 DIAGNOSIS — G8929 Other chronic pain: Secondary | ICD-10-CM | POA: Insufficient documentation

## 2015-07-10 DIAGNOSIS — M5416 Radiculopathy, lumbar region: Secondary | ICD-10-CM

## 2015-07-10 DIAGNOSIS — M129 Arthropathy, unspecified: Secondary | ICD-10-CM | POA: Diagnosis not present

## 2015-07-10 MED ORDER — BUPIVACAINE HCL (PF) 0.25 % IJ SOLN
INTRAMUSCULAR | Status: AC
  Administered 2015-07-10: 30 mL
  Filled 2015-07-10: qty 30

## 2015-07-10 MED ORDER — BUPIVACAINE HCL (PF) 0.25 % IJ SOLN: 20.0000 mL | Freq: Once | INTRAMUSCULAR | Status: DC

## 2015-07-10 MED ORDER — TRAMADOL HCL 50 MG PO TABS: 50.0000 mg | ORAL_TABLET | Freq: Two times a day (BID) | ORAL | Status: DC | PRN

## 2015-07-10 MED ORDER — IOHEXOL 180 MG/ML  SOLN
INTRAMUSCULAR | Status: AC
  Administered 2015-07-10: 20 mL
  Filled 2015-07-10: qty 20

## 2015-07-10 MED ORDER — TRIAMCINOLONE ACETONIDE 40 MG/ML IJ SUSP
INTRAMUSCULAR | Status: AC
  Administered 2015-07-10: 40 mg
  Filled 2015-07-10: qty 2

## 2015-07-10 MED ORDER — FENTANYL CITRATE (PF) 100 MCG/2ML IJ SOLN
INTRAMUSCULAR | Status: AC
  Administered 2015-07-10: 50 ug via INTRAVENOUS
  Filled 2015-07-10: qty 2

## 2015-07-10 MED ORDER — FENTANYL CITRATE (PF) 100 MCG/2ML IJ SOLN: 50.0000 ug | INTRAMUSCULAR | Status: DC

## 2015-07-10 MED ORDER — MIDAZOLAM HCL 5 MG/5ML IJ SOLN: 1.0000 mg | INTRAMUSCULAR | Status: DC

## 2015-07-10 MED ORDER — TRIAMCINOLONE ACETONIDE 40 MG/ML IJ SUSP
INTRAMUSCULAR | Status: AC
  Administered 2015-07-10: 80 mg
  Filled 2015-07-10: qty 1

## 2015-07-10 MED ORDER — MIDAZOLAM HCL 5 MG/5ML IJ SOLN
INTRAMUSCULAR | Status: AC
  Administered 2015-07-10: 2 mg via INTRAVENOUS
  Filled 2015-07-10: qty 5

## 2015-07-10 MED ORDER — TRIAMCINOLONE ACETONIDE 40 MG/ML IJ SUSP: 80.0000 mg | Freq: Once | INTRAMUSCULAR | Status: DC

## 2015-07-10 MED ORDER — LIDOCAINE HCL (PF) 1 % IJ SOLN
INTRAMUSCULAR | Status: AC
  Filled 2015-07-10: qty 5

## 2015-07-10 MED ORDER — IOHEXOL 180 MG/ML  SOLN: 20.0000 mL | INTRAMUSCULAR | Status: DC | PRN

## 2015-07-10 NOTE — Progress Notes (Signed)
Safety precautions to be maintained throughout the outpatient stay will include: orient to surroundings, keep bed in low position, maintain call bell within reach at all times, provide assistance with transfer out of bed and ambulation.  

## 2015-07-10 NOTE — Procedures (Signed)
Date of procedure: 07/10/2015  Preoperative Diagnosis:  1 chronic low back pain 2 lumbar degenerative disc disease 3 lumbar radiculopathy  Postoperative Diagnosis:  Same.  Procedure: 1. Caudal epidural steroid injection, 2. Epidural with interpretation. 3. Fluoroscopic guidance.  Surgeon: Lance Bosch, MD  Anesthesia: MAC anesthesia by the nurse and staff under my direction  Informed consent was obtained and the patient appeared to accept and understand the benefits and risks of this procedure.   Pre procedure comments:  None  Description of the Procedure:  The patient was taken to the operating room and placed in the prone position.  Intravenous sedation and MAC anesthesia was administered by the nurse and staff under my direction. After appropriate sedation, the sacrococcygeal area was prepped with Betadine.  After adequate draping, the area between the sacral cornu was palpated and infiltrated with 3 cc of 1% Lidocaine.   An AP fluoroscopic view of the sacrum was visualized and a 17 gauge Tuohy needle was inserted in the midline at the angle of 45 degrees through the sacrococcygeal membrane.  After making contact with the bone, the needle was withdrawn and readvanced in horizontal position, into the caudal epidural space.  Epidurogram Study: One cc of Omnipaque 180 was injected through the needle and epidurogram was visualized in both the later and AP views. After injecting the contrast into the to a needle the dye was observed to spread in a cephalad direction as high as L4 The spread was observed to be bilateral but the peripheral spread down all of the nerve roots were rather limited bilaterally  Comments:   This procedure was done using fluoroscopic guidance Scopic parameters were as follows: Number of fluoroscopic frames was 3 Fluoroscopic time was 0.1 minutes MG Y was 1.0 No catheter was used.  Caudal Epidural Steroid Injection:  Then 10 cc of 0.25%  Bupivacaine and 80 mg of Kenalog were injected into the Caudal epidural space.   The needle was removed and adequate hemostasis was established.    The patient tolerated the procedure quite well and vital signs were stable.   There were no adverse effects.  Additional comments:    The patient was taken to the recovery room in satisfactory condition where the patient was observed and subsequently discharged home.   Will follow up in the clinic in the next month.Lance Bosch M.D.

## 2015-07-10 NOTE — Patient Instructions (Signed)
Pain Management Discharge Instructions  General Discharge Instructions :  If you need to reach your doctor call: Monday-Friday 8:00 am - 4:00 pm at [phone] or toll free [phone].  After clinic hours [phone] to have operator reach doctor.  Bring all of your medication bottles to all your appointments in the pain clinic.  To cancel or reschedule your appointment with Pain Management please remember to call 24 hours in advance to avoid a fee.  Refer to the educational materials which you have been given on: General Risks, I had my Procedure. Discharge Instructions, Post Sedation.  Post Procedure Instructions:  The drugs you were given will stay in your system until tomorrow, so for the next 24 hours you should not drive, make any legal decisions or drink any alcoholic beverages.  You may eat anything you prefer, but it is better to start with liquids then soups and crackers, and gradually work up to solid foods.  Please notify your doctor immediately if you have any unusual bleeding, trouble breathing or pain that is not related to your normal pain.  Depending on the type of procedure that was done, some parts of your body may feel week and/or numb.  This usually clears up by tonight or the next day.  Walk with the use of an assistive device or accompanied by an adult for the 24 hours.  You may use ice on the affected area for the first 24 hours.  Put ice in a Ziploc bag and cover with a towel and place against area 15 minutes on 15 minutes off.  You may switch to heat after 24 hours.  A prescription for TRAMADOL was given to you today.

## 2015-07-10 NOTE — Progress Notes (Signed)
   Subjective:    Patient ID: Michael Patterson, male    DOB: Feb 24, 1961, 54 y.o.   MRN: [id number]  HPI    Review of Systems     Objective:   Physical Exam        Assessment & Plan:

## 2015-07-11 ENCOUNTER — Telehealth: Payer: Self-pay | Admitting: *Deleted

## 2015-07-11 NOTE — Telephone Encounter (Signed)
No problems post procedure. 

## 2015-08-06 ENCOUNTER — Ambulatory Visit: Payer: Medicare Other | Admitting: Pain Medicine

## 2015-08-07 ENCOUNTER — Ambulatory Visit: Payer: Medicare Other | Attending: Pain Medicine | Admitting: Anesthesiology

## 2015-08-07 ENCOUNTER — Encounter: Payer: Self-pay | Admitting: Anesthesiology

## 2015-08-07 VITALS — BP 138/95 | HR 64 | Temp 98.4°F | Resp 16 | Ht 73.0 in | Wt 165.0 lb

## 2015-08-07 DIAGNOSIS — M5116 Intervertebral disc disorders with radiculopathy, lumbar region: Secondary | ICD-10-CM | POA: Diagnosis not present

## 2015-08-07 DIAGNOSIS — M545 Low back pain, unspecified: Secondary | ICD-10-CM

## 2015-08-07 DIAGNOSIS — M5442 Lumbago with sciatica, left side: Secondary | ICD-10-CM | POA: Diagnosis not present

## 2015-08-07 DIAGNOSIS — G8929 Other chronic pain: Secondary | ICD-10-CM | POA: Diagnosis not present

## 2015-08-07 DIAGNOSIS — M5417 Radiculopathy, lumbosacral region: Secondary | ICD-10-CM | POA: Diagnosis not present

## 2015-08-07 DIAGNOSIS — M5416 Radiculopathy, lumbar region: Secondary | ICD-10-CM

## 2015-08-07 DIAGNOSIS — M5137 Other intervertebral disc degeneration, lumbosacral region: Secondary | ICD-10-CM | POA: Diagnosis not present

## 2015-08-07 MED ORDER — OXYCODONE-ACETAMINOPHEN 5-325 MG PO TABS: 1.0000 | ORAL_TABLET | Freq: Two times a day (BID) | ORAL | Status: DC

## 2015-08-07 NOTE — Progress Notes (Signed)
Safety precautions to be maintained throughout the outpatient stay will include: orient to surroundings, keep bed in low position, maintain call bell within reach at all times, provide assistance with transfer out of bed and ambulation.  

## 2015-08-07 NOTE — Progress Notes (Signed)
   Subjective:    Patient ID: Michael Patterson, male    DOB: [birth year], 55 y.o.   MRN: [id number]  HPI  Michael Patterson return to the clinic that he got minimal relief from his last procedure He indicated that today his pain is very severe and that his subjective pain intensity rating is 100% This suggests pain exaggeration and pain dramatization probably secondary to the fact that he did not get the amount of opioids that he had requested. He does not appear to be in any signiantf distress His general condition and his appearance is not consistent with whose pain is 100% Nevertheless we would give him the benefit of about  Review of Systems  Constitutional: Negative.   HENT: Negative.   Eyes: Negative.   Respiratory: Negative.   Cardiovascular: Negative.   Gastrointestinal: Negative.   Endocrine: Negative.   Genitourinary: Negative.   Musculoskeletal: Negative.   Skin: Negative.   Allergic/Immunologic: Negative.   Neurological: Negative.   Hematological: Negative.   Psychiatric/Behavioral: Negative.        Objective:   Physical Exam  Cardiovascular:  This gentleman appears to be quite relaxed and in no significant distress His blood pressure is 138/95 mmHg His pulse is 84 bpm Equal and regular Heart sounds 1 and 2 were heard in all  Areas There were no audible murmurs Temperature is 98.12F Respirations are 18 breaths per minut SPO2 was 100% Chest is clinically clear There are no adventitious sounds Abdomen is soft and nontender There is no palpable organomegaly There is some tenderness in thelower back area in the region of L4 and L5 Range of motion is moderately decreased Pupils are equal and reactive Cranial nerves are intact There are no new neurological normal muscular findings.  Nursing note and vitals reviewed.         Assessment & Plan:   Assessment 1 chronic low back pain 2  lumbar degenerative disc disease 3 lumbar radiculopathy   Plan of  management 1 we'll perform a caudal epidural steroid injection in the next 3 days 2 were giving him Roxicet 5/325 one tablet twice a day and give hi 3 we will follow-up with him in 1 month   Established patient      Level III    Lance Bosch M.D.

## 2015-08-07 NOTE — Patient Instructions (Signed)
You were given a prescription for Oxycodone/Acetaminophen today. Epidural Steroid Injection Patient Information  Description: The epidural space surrounds the nerves as they exit the spinal cord.  In some patients, the nerves can be compressed and inflamed by a bulging disc or a tight spinal canal (spinal stenosis).  By injecting steroids into the epidural space, we can bring irritated nerves into direct contact with a potentially helpful medication.  These steroids act directly on the irritated nerves and can reduce swelling and inflammation which often leads to decreased pain.  Epidural steroids may be injected anywhere along the spine and from the neck to the low back depending upon the location of your pain.   After numbing the skin with local anesthetic (like Novocaine), a small needle is passed into the epidural space slowly.  You may experience a sensation of pressure while this is being done.  The entire block usually last less than 10 minutes.  Conditions which may be treated by epidural steroids:   Low back and leg pain  Neck and arm pain  Spinal stenosis  Post-laminectomy syndrome  Herpes zoster (shingles) pain  Pain from compression fractures  Preparation for the injection:  1. Do not eat any solid food or dairy products within 8 hours of your appointment.  2. You may drink clear liquids up to 3 hours before appointment.  Clear liquids include water, black coffee, juice or soda.  No milk or cream please. 3. You may take your regular medication, including pain medications, with a sip of water before your appointment  Diabetics should hold regular insulin (if taken separately) and take 1/2 normal NPH dos the morning of the procedure.  Carry some sugar containing items with you to your appointment. 4. A driver must accompany you and be prepared to drive you home after your procedure.  5. Bring all your current medications with your. 6. An IV may be inserted and sedation may be  given at the discretion of the physician.   7. A blood pressure cuff, EKG and other monitors will often be applied during the procedure.  Some patients may need to have extra oxygen administered for a short period. 8. You will be asked to provide medical information, including your allergies, prior to the procedure.  We must know immediately if you are taking blood thinners (like Coumadin/Warfarin)  Or if you are allergic to IV iodine contrast (dye). We must know if you could possible be pregnant.  Possible side-effects:  Bleeding from needle site  Infection (rare, may require surgery)  Nerve injury (rare)  Numbness & tingling (temporary)  Difficulty urinating (rare, temporary)  Spinal headache ( a headache worse with upright posture)  Light -headedness (temporary)  Pain at injection site (several days)  Decreased blood pressure (temporary)  Weakness in arm/leg (temporary)  Pressure sensation in back/neck (temporary)  Call if you experience:  Fever/chills associated with headache or increased back/neck pain.  Headache worsened by an upright position.  New onset weakness or numbness of an extremity below the injection site  Hives or difficulty breathing (go to the emergency room)  Inflammation or drainage at the infection site  Severe back/neck pain  Any new symptoms which are concerning to you  Please note:  Although the local anesthetic injected can often make your back or neck feel good for several hours after the injection, the pain will likely return.  It takes 3-7 days for steroids to work in the epidural space.  You may not notice any pain  relief for at least that one week.  If effective, we will often do a series of three injections spaced 3-6 weeks apart to maximally decrease your pain.  After the initial series, we generally will wait several months before considering a repeat injection of the same type.  If you have any questions, please call [phone] [facility]

## 2015-08-10 ENCOUNTER — Ambulatory Visit: Payer: Medicare Other | Admitting: Anesthesiology

## 2015-09-14 ENCOUNTER — Encounter: Payer: Self-pay | Admitting: Anesthesiology

## 2015-09-14 ENCOUNTER — Ambulatory Visit: Payer: Medicare Other | Attending: Anesthesiology | Admitting: Anesthesiology

## 2015-09-14 VITALS — BP 155/88 | HR 65 | Temp 98.7°F | Resp 16 | Ht 73.0 in | Wt 165.0 lb

## 2015-09-14 DIAGNOSIS — G8929 Other chronic pain: Secondary | ICD-10-CM | POA: Insufficient documentation

## 2015-09-14 DIAGNOSIS — M545 Low back pain, unspecified: Secondary | ICD-10-CM

## 2015-09-14 DIAGNOSIS — M5137 Other intervertebral disc degeneration, lumbosacral region: Secondary | ICD-10-CM

## 2015-09-14 DIAGNOSIS — M5417 Radiculopathy, lumbosacral region: Secondary | ICD-10-CM | POA: Diagnosis not present

## 2015-09-14 DIAGNOSIS — M5442 Lumbago with sciatica, left side: Secondary | ICD-10-CM | POA: Diagnosis not present

## 2015-09-14 DIAGNOSIS — M1612 Unilateral primary osteoarthritis, left hip: Secondary | ICD-10-CM | POA: Diagnosis not present

## 2015-09-14 DIAGNOSIS — M5116 Intervertebral disc disorders with radiculopathy, lumbar region: Secondary | ICD-10-CM | POA: Diagnosis not present

## 2015-09-14 DIAGNOSIS — M5416 Radiculopathy, lumbar region: Secondary | ICD-10-CM

## 2015-09-14 MED ORDER — OXYCODONE-ACETAMINOPHEN 5-325 MG PO TABS: 1.0000 | ORAL_TABLET | ORAL | Status: DC

## 2015-09-14 NOTE — Patient Instructions (Addendum)
Epidural Steroid Injection Patient Information  Description: The epidural space surrounds the nerves as they exit the spinal cord.  In some patients, the nerves can be compressed and inflamed by a bulging disc or a tight spinal canal (spinal stenosis).  By injecting steroids into the epidural space, we can bring irritated nerves into direct contact with a potentially helpful medication.  These steroids act directly on the irritated nerves and can reduce swelling and inflammation which often leads to decreased pain.  Epidural steroids may be injected anywhere along the spine and from the neck to the low back depending upon the location of your pain.   After numbing the skin with local anesthetic (like Novocaine), a small needle is passed into the epidural space slowly.  You may experience a sensation of pressure while this is being done.  The entire block usually last less than 10 minutes.  Conditions which may be treated by epidural steroids:   Low back and leg pain  Neck and arm pain  Spinal stenosis  Post-laminectomy syndrome  Herpes zoster (shingles) pain  Pain from compression fractures  Preparation for the injection:  1. Do not eat any solid food or dairy products within 8 hours of your appointment.  2. You may drink clear liquids up to 3 hours before appointment.  Clear liquids include water, black coffee, juice or soda.  No milk or cream please. 3. You may take your regular medication, including pain medications, with a sip of water before your appointment  Diabetics should hold regular insulin (if taken separately) and take 1/2 normal NPH dos the morning of the procedure.  Carry some sugar containing items with you to your appointment. 4. A driver must accompany you and be prepared to drive you home after your procedure.  5. Bring all your current medications with your. 6. An IV may be inserted and sedation may be given at the discretion of the physician.   7. A blood pressure  cuff, EKG and other monitors will often be applied during the procedure.  Some patients may need to have extra oxygen administered for a short period. 8. You will be asked to provide medical information, including your allergies, prior to the procedure.  We must know immediately if you are taking blood thinners (like Coumadin/Warfarin)  Or if you are allergic to IV iodine contrast (dye). We must know if you could possible be pregnant.  Possible side-effects:  Bleeding from needle site  Infection (rare, may require surgery)  Nerve injury (rare)  Numbness & tingling (temporary)  Difficulty urinating (rare, temporary)  Spinal headache ( a headache worse with upright posture)  Light -headedness (temporary)  Pain at injection site (several days)  Decreased blood pressure (temporary)  Weakness in arm/leg (temporary)  Pressure sensation in back/neck (temporary)  Call if you experience:  Fever/chills associated with headache or increased back/neck pain.  Headache worsened by an upright position.  New onset weakness or numbness of an extremity below the injection site  Hives or difficulty breathing (go to the emergency room)  Inflammation or drainage at the infection site  Severe back/neck pain  Any new symptoms which are concerning to you  Please note:  Although the local anesthetic injected can often make your back or neck feel good for several hours after the injection, the pain will likely return.  It takes 3-7 days for steroids to work in the epidural space.  You may not notice any pain relief for at least that one week.    If effective, we will often do a series of three injections spaced 3-6 weeks apart to maximally decrease your pain.  After the initial series, we generally will wait several months before considering a repeat injection of the same type.  If you have any questions, please call [phone] [place] were given a  prescription for Percocet.

## 2015-09-14 NOTE — Progress Notes (Signed)
Safety precautions to be maintained throughout the outpatient stay will include: orient to surroundings, keep bed in low position, maintain call bell within reach at all times, provide assistance with transfer out of bed and ambulation.  

## 2015-09-15 NOTE — Progress Notes (Signed)
   Subjective:    Patient ID: Michael Patterson, male    DOB: Sep 11, 1960, 55 y.o.   MRN: [id number]  HPI  This patient return to theday indicating that h is continuing to have back pain He did not have his last procedure so he is desirous of having [place] repeat the procedure for him at the next visit Subjective pain intensity rating is 75% His back pain appears to be interfering with his activities of daily living  and he has some difficulty ambulating. He indicates that he has continued to cut back on his opioid medication and is currently only taking Roxicet 5/325 mg 1 tablet at night   Review of Systems  Constitutional: Negative.   HENT: Negative.   Eyes: Negative.   Respiratory: Negative.   Cardiovascular: Negative.   Gastrointestinal: Negative.   Endocrine: Negative.   Genitourinary: Negative.   Musculoskeletal: Positive for myalgias, back pain, joint swelling, arthralgias and gait problem. Negative for neck pain and neck stiffness.  Skin: Negative.   Allergic/Immunologic: Negative.   Neurological: Negative.   Hematological: Negative.   Psychiatric/Behavioral: Negative.        Objective:   Physical Exam  Cardiovascular:  This patient appears to be in some modest distress and he certainly has difficulty ambulating His blood  pressure is 155/88 mmHg His pulse is 65 bpm Equal and regular Heart  sounds 1 and 2 were heard in allareas There were no audible murmurs Temperature is 98.7 Respirations was 16 breaths per min sPO2 was 100% Chest was clinically clear Abdomen was soft and nontender There was no palpable organomegaly There was no significant lymphadeno Pupils were equal and reactive Cranial nerves were intact Patient had alcoholic facies There were no new neurological no musculo-skeletal findings   Nursing note and vitals reviewed.         Assessment & Plan:   Assessment 1 chronic low back pain 2  lumbar degenerative disc disease 3 lumbar  radiculopathy 4 Osteoarthritis of the left hip    Plan of management 1 we will plan a caudal epidural steroid injection for him in the next 3 weeks 2 we will continue him on Roxicet 5/325 mg one tablet QHS # 21  Tablets 3 We'll follow up with him in 3 weeks for his procedure      Established patient     Level III    Lance Bosch M.D.

## 2015-10-05 ENCOUNTER — Ambulatory Visit: Payer: Medicare Other | Attending: Anesthesiology | Admitting: Anesthesiology

## 2015-10-05 ENCOUNTER — Encounter: Payer: Self-pay | Admitting: Anesthesiology

## 2015-10-05 VITALS — BP 152/78 | HR 76 | Resp 18 | Ht 73.0 in | Wt 165.0 lb

## 2015-10-05 DIAGNOSIS — M5442 Lumbago with sciatica, left side: Secondary | ICD-10-CM | POA: Diagnosis not present

## 2015-10-05 DIAGNOSIS — M5137 Other intervertebral disc degeneration, lumbosacral region: Secondary | ICD-10-CM | POA: Diagnosis not present

## 2015-10-05 DIAGNOSIS — M545 Low back pain, unspecified: Secondary | ICD-10-CM

## 2015-10-05 DIAGNOSIS — M5416 Radiculopathy, lumbar region: Secondary | ICD-10-CM

## 2015-10-05 DIAGNOSIS — R52 Pain, unspecified: Secondary | ICD-10-CM | POA: Diagnosis present

## 2015-10-05 DIAGNOSIS — M5116 Intervertebral disc disorders with radiculopathy, lumbar region: Secondary | ICD-10-CM | POA: Diagnosis not present

## 2015-10-05 DIAGNOSIS — M5417 Radiculopathy, lumbosacral region: Secondary | ICD-10-CM | POA: Diagnosis not present

## 2015-10-05 DIAGNOSIS — G8929 Other chronic pain: Secondary | ICD-10-CM | POA: Diagnosis not present

## 2015-10-05 MED ORDER — TRIAMCINOLONE ACETONIDE 40 MG/ML IJ SUSP: 80.0000 mg | Freq: Once | INTRAMUSCULAR | Status: DC

## 2015-10-05 MED ORDER — IOPAMIDOL (ISOVUE-M 200) INJECTION 41%: 20.0000 mL | INTRAMUSCULAR | Status: DC | PRN

## 2015-10-05 MED ORDER — OXYCODONE-ACETAMINOPHEN 5-325 MG PO TABS: 1.0000 | ORAL_TABLET | ORAL | Status: DC

## 2015-10-05 NOTE — Progress Notes (Signed)
Safety precautions to be maintained throughout the outpatient stay will include: orient to surroundings, keep bed in low position, maintain call bell within reach at all times, provide assistance with transfer out of bed and ambulation.  

## 2015-10-05 NOTE — Progress Notes (Signed)
   Subjective:    Patient ID: Michael Patterson, male    DOB: [birth year], 55 y.o.   MRN: [id number]  HPI  This patient returned to clinic today indicating that his pain continues His subjective pain intensity rating is 80% He has continual heavy drinker of alcohol He is scheduled for a caudal epidural steroid injection today but he has no driver We'll therefore reschedule the procedure in one month and give him his medicine to take at nights  Review of Systems  Constitutional: Negative.   HENT: Negative.   Eyes: Negative.   Respiratory: Negative.   Cardiovascular: Negative.   Gastrointestinal: Negative.   Endocrine: Negative.   Genitourinary: Negative.   Musculoskeletal: Negative.   Skin: Negative.   Allergic/Immunologic: Negative.   Neurological: Negative.   Hematological: Negative.   Psychiatric/Behavioral: Negative.        Objective:   Physical Exam  Cardiovascular:  Patient does not appear to be in any distress Vital signs are stable There are no new neurological or musculoskeletal findings  Nursing note and vitals reviewed.         Assessment & Plan:    Assessment 1 chronic low back pain 2 lumbar degenerative disc disease 3 lumbar radiculopathy   Plan of management I since the patient does not have a driver we will defer his caudal epidural steroid injection and give him Roxicet 5/325 mg to be taken at night We'll follow-up with him in 3 weeks    Established patient     level II   Lance Bosch M.D.

## 2015-10-05 NOTE — Patient Instructions (Addendum)
Smoking Cessation, Tips for Success If you are ready to quit smoking, congratulations! You have chosen to help yourself be healthier. Cigarettes bring nicotine, tar, carbon monoxide, and other irritants into your body. Your lungs, heart, and blood vessels will be able to work better without these poisons. There are many different ways to quit smoking. Nicotine gum, nicotine patches, a nicotine inhaler, or nicotine nasal spray can help with physical craving. Hypnosis, support groups, and medicines help break the habit of smoking. WHAT THINGS CAN I DO TO MAKE QUITTING EASIER?  Here are some tips to help you quit for good: 1. Pick a date when you will quit smoking completely. Tell all of your friends and family about your plan to quit on that date. 2. Do not try to slowly cut down on the number of cigarettes you are smoking. Pick a quit date and quit smoking completely starting on that day. 3. Throw away all cigarettes.  4. Clean and remove all ashtrays from your home, work, and car. 5. On a card, write down your reasons for quitting. Carry the card with you and read it when you get the urge to smoke. 6. Cleanse your body of nicotine. Drink enough water and fluids to keep your urine clear or pale yellow. Do this after quitting to flush the nicotine from your body. 7. Learn to predict your moods. Do not let a bad situation be your excuse to have a cigarette. Some situations in your life might tempt you into wanting a cigarette. 8. Never have "just one" cigarette. It leads to wanting another and another. Remind yourself of your decision to quit. 9. Change habits associated with smoking. If you smoked while driving or when feeling stressed, try other activities to replace smoking. Stand up when drinking your coffee. Brush your teeth after eating. Sit in a different chair when you read the paper. Avoid alcohol while trying to quit, and try to drink fewer caffeinated beverages. Alcohol and caffeine may urge you  to smoke. 10. Avoid foods and drinks that can trigger a desire to smoke, such as sugary or spicy foods and alcohol. 11. Ask people who smoke not to smoke around you. 12. Have something planned to do right after eating or having a cup of coffee. For example, plan to take a walk or exercise. 13. Try a relaxation exercise to calm you down and decrease your stress. Remember, you may be tense and nervous for the first 2 weeks after you quit, but this will pass. 14. Find new activities to keep your hands busy. Play with a pen, coin, or rubber band. Doodle or draw things on paper. 15. Brush your teeth right after eating. This will help cut down on the craving for the taste of tobacco after meals. You can also try mouthwash.  16. Use oral substitutes in place of cigarettes. Try using lemon drops, carrots, cinnamon sticks, or chewing gum. Keep them handy so they are available when you have the urge to smoke. 17. When you have the urge to smoke, try deep breathing. 18. Designate your home as a nonsmoking area. 19. If you are a heavy smoker, ask your health care provider about a prescription for nicotine chewing gum. It can ease your withdrawal from nicotine. 20. Reward yourself. Set aside the cigarette money you save and buy yourself something nice. 21. Look for support from others. Join a support group or smoking cessation program. Ask someone at home or at work to help you with your plan   to quit smoking. 22. Always ask yourself, "Do I need this cigarette or is this just a reflex?" Tell yourself, "Today, I choose not to smoke," or "I do not want to smoke." You are reminding yourself of your decision to quit. 23. Do not replace cigarette smoking with electronic cigarettes (commonly called e-cigarettes). The safety of e-cigarettes is unknown, and some may contain harmful chemicals. 24. If you relapse, do not give up! Plan ahead and think about what you will do the next time you get the urge to smoke. HOW WILL  I FEEL WHEN I QUIT SMOKING? You may have symptoms of withdrawal because your body is used to nicotine (the addictive substance in cigarettes). You may crave cigarettes, be irritable, feel very hungry, cough often, get headaches, or have difficulty concentrating. The withdrawal symptoms are only temporary. They are strongest when you first quit but will go away within 10-14 days. When withdrawal symptoms occur, stay in control. Think about your reasons for quitting. Remind yourself that these are signs that your body is healing and getting used to being without cigarettes. Remember that withdrawal symptoms are easier to treat than the major diseases that smoking can cause.  Even after the withdrawal is over, expect periodic urges to smoke. However, these cravings are generally short lived and will go away whether you smoke or not. Do not smoke! WHAT RESOURCES ARE AVAILABLE TO HELP ME QUIT SMOKING? Your health care provider can direct you to community resources or hospitals for support, which may include:  Group support.  Education.  Hypnosis.  Therapy.   This information is not intended to replace advice given to you by your health care provider. Make sure you discuss any questions you have with your health care provider.   Document Released: 01/18/2004 Document Revised: 05/12/2014 Document Reviewed: 10/07/2012 [employer] Interactive Patient Education 2016 [employer] Pain Management Discharge Instructions  General Discharge Instructions :  If you need to reach your doctor call: Monday-Friday 8:00 am - 4:00 pm at [phone] or toll free [phone].  After clinic hours [phone] to have operator reach doctor.  Bring all of your medication bottles to all your appointments in the pain clinic.  To cancel or reschedule your appointment with Pain Management please remember to call 24 hours in advance to avoid a fee.  Refer to the educational materials which you have been given on:  General Risks, I had my Procedure. Discharge Instructions, Post Sedation.  Post Procedure Instructions:  The drugs you were given will stay in your system until tomorrow, so for the next 24 hours you should not drive, make any legal decisions or drink any alcoholic beverages.  You may eat anything you prefer, but it is better to start with liquids then soups and crackers, and gradually work up to solid foods.  Please notify your doctor immediately if you have any unusual bleeding, trouble breathing or pain that is not related to your normal pain.  Depending on the type of procedure that was done, some parts of your body may feel week and/or numb.  This usually clears up by tonight or the next day.  Walk with the use of an assistive device or accompanied by an adult for the 24 hours.  You may use ice on the affected area for the first 24 hours.  Put ice in a Ziploc bag and cover with a towel and place against area 15 minutes on 15 minutes off.  You may switch to heat after 24 hours.GENERAL RISKS AND   COMPLICATIONS  What are the risk, side effects and possible complications? Generally speaking, most procedures are safe.  However, with any procedure there are risks, side effects, and the possibility of complications.  The risks and complications are dependent upon the sites that are lesioned, or the type of nerve block to be performed.  The closer the procedure is to the spine, the more serious the risks are.  Great care is taken when placing the radio frequency needles, block needles or lesioning probes, but sometimes complications can occur. 1. Infection: Any time there is an injection through the skin, there is a risk of infection.  This is why sterile conditions are used for these blocks.  There are four possible types of infection. 1. Localized skin infection. 2. Central Nervous System Infection-This can be in the form of Meningitis, which can be deadly. 3. Epidural Infections-This can be in  the form of an epidural abscess, which can cause pressure inside of the spine, causing compression of the spinal cord with subsequent paralysis. This would require an emergency surgery to decompress, and there are no guarantees that the patient would recover from the paralysis. 4. Discitis-This is an infection of the intervertebral discs.  It occurs in about 1% of discography procedures.  It is difficult to treat and it may lead to surgery.        2. Pain: the needles have to go through skin and soft tissues, will cause soreness.       3. Damage to internal structures:  The nerves to be lesioned may be near blood vessels or    other nerves which can be potentially damaged.       4. Bleeding: Bleeding is more common if the patient is taking blood thinners such as  aspirin, Coumadin, Ticiid, Plavix, etc., or if he/she have some genetic predisposition  such as hemophilia. Bleeding into the spinal canal can cause compression of the spinal  cord with subsequent paralysis.  This would require an emergency surgery to  decompress and there are no guarantees that the patient would recover from the  paralysis.       5. Pneumothorax:  Puncturing of a lung is a possibility, every time a needle is introduced in  the area of the chest or upper back.  Pneumothorax refers to free air around the  collapsed lung(s), inside of the thoracic cavity (chest cavity).  Another two possible  complications related to a similar event would include: Hemothorax and Chylothorax.   These are variations of the Pneumothorax, where instead of air around the collapsed  lung(s), you may have blood or chyle, respectively.       6. Spinal headaches: They may occur with any procedures in the area of the spine.       7. Persistent CSF (Cerebro-Spinal Fluid) leakage: This is a rare problem, but may occur  with prolonged intrathecal or epidural catheters either due to the formation of a fistulous  track or a dural tear.       8. Nerve damage: By  working so close to the spinal cord, there is always a possibility of  nerve damage, which could be as serious as a permanent spinal cord injury with  paralysis.       9. Death:  Although rare, severe deadly allergic reactions known as "Anaphylactic  reaction" can occur to any of the medications used.      10. Worsening of the symptoms:  We can always make thing worse.  What   are the chances of something like this happening? Chances of any of this occuring are extremely low.  By statistics, you have more of a chance of getting killed in a motor vehicle accident: while driving to the hospital than any of the above occurring .  Nevertheless, you should be aware that they are possibilities.  In general, it is similar to taking a shower.  Everybody knows that you can slip, hit your head and get killed.  Does that mean that you should not shower again?  Nevertheless always keep in mind that statistics do not mean anything if you happen to be on the wrong side of them.  Even if a procedure has a 1 (one) in a 1,000,000 (million) chance of going wrong, it you happen to be that one..Also, keep in mind that by statistics, you have more of a chance of having something go wrong when taking medications.  Who should not have this procedure? If you are on a blood thinning medication (e.g. Coumadin, Plavix, see list of "Blood Thinners"), or if you have an active infection going on, you should not have the procedure.  If you are taking any blood thinners, please inform your physician.  How should I prepare for this procedure?  Do not eat or drink anything at least six hours prior to the procedure.  Bring a driver with you .  It cannot be a taxi.  Come accompanied by an adult that can drive you back, and that is strong enough to help you if your legs get weak or numb from the local anesthetic.  Take all of your medicines the morning of the procedure with just enough water to swallow them.  If you have diabetes,  make sure that you are scheduled to have your procedure done first thing in the morning, whenever possible.  If you have diabetes, take only half of your insulin dose and notify our nurse that you have done so as soon as you arrive at the clinic.  If you are diabetic, but only take blood sugar pills (oral hypoglycemic), then do not take them on the morning of your procedure.  You may take them after you have had the procedure.  Do not take aspirin or any aspirin-containing medications, at least eleven (11) days prior to the procedure.  They may prolong bleeding.  Wear loose fitting clothing that may be easy to take off and that you would not mind if it got stained with Betadine or blood.  Do not wear any jewelry or perfume  Remove any nail coloring.  It will interfere with some of our monitoring equipment.  NOTE: Remember that this is not meant to be interpreted as a complete list of all possible complications.  Unforeseen problems may occur.  BLOOD THINNERS The following drugs contain aspirin or other products, which can cause increased bleeding during surgery and should not be taken for 2 weeks prior to and 1 week after surgery.  If you should need take something for relief of minor pain, you may take acetaminophen which is found in Tylenol,m Datril, Anacin-3 and Panadol. It is not blood thinner. The products listed below are.  Do not take any of the products listed below in addition to any listed on your instruction sheet.  A.P.C or A.P.C with Codeine Codeine Phosphate Capsules #3 Ibuprofen Ridaura  ABC compound Congesprin Imuran rimadil  Advil Cope Indocin Robaxisal  Alka-Seltzer Effervescent Pain Reliever and Antacid Coricidin or Coricidin-D  Indomethacin Rufen  Alka-Seltzer plus   Cold Medicine Cosprin Ketoprofen S-A-C Tablets  Anacin Analgesic Tablets or Capsules Coumadin Korlgesic Salflex  Anacin Extra Strength Analgesic tablets or capsules CP-2 Tablets Lanoril Salicylate  Anaprox  Cuprimine Capsules Levenox Salocol  Anexsia-D Dalteparin Magan Salsalate  Anodynos Darvon compound Magnesium Salicylate Sine-off  Ansaid Dasin Capsules Magsal Sodium Salicylate  Anturane Depen Capsules Marnal Soma  APF Arthritis pain formula Dewitt's Pills Measurin Stanback  Argesic Dia-Gesic Meclofenamic Sulfinpyrazone  Arthritis Bayer Timed Release Aspirin Diclofenac Meclomen Sulindac  Arthritis pain formula Anacin Dicumarol Medipren Supac  Analgesic (Safety coated) Arthralgen Diffunasal Mefanamic Suprofen  Arthritis Strength Bufferin Dihydrocodeine Mepro Compound Suprol  Arthropan liquid Dopirydamole Methcarbomol with Aspirin Synalgos  ASA tablets/Enseals Disalcid Micrainin Tagament  Ascriptin Doan's Midol Talwin  Ascriptin A/D Dolene Mobidin Tanderil  Ascriptin Extra Strength Dolobid Moblgesic Ticlid  Ascriptin with Codeine Doloprin or Doloprin with Codeine Momentum Tolectin  Asperbuf Duoprin Mono-gesic Trendar  Aspergum Duradyne Motrin or Motrin IB Triminicin  Aspirin plain, buffered or enteric coated Durasal Myochrisine Trigesic  Aspirin Suppositories Easprin Nalfon Trillsate  Aspirin with Codeine Ecotrin Regular or Extra Strength Naprosyn Uracel  Atromid-S Efficin Naproxen Ursinus  Auranofin Capsules Elmiron Neocylate Vanquish  Axotal Emagrin Norgesic Verin  Azathioprine Empirin or Empirin with Codeine Normiflo Vitamin E  Azolid Emprazil Nuprin Voltaren  Bayer Aspirin plain, buffered or children's or timed BC Tablets or powders Encaprin Orgaran Warfarin Sodium  Buff-a-Comp Enoxaparin Orudis Zorpin  Buff-a-Comp with Codeine Equegesic Os-Cal-Gesic   Buffaprin Excedrin plain, buffered or Extra Strength Oxalid   Bufferin Arthritis Strength Feldene Oxphenbutazone   Bufferin plain or Extra Strength Feldene Capsules Oxycodone with Aspirin   Bufferin with Codeine Fenoprofen Fenoprofen Pabalate or Pabalate-SF   Buffets II Flogesic Panagesic   Buffinol plain or Extra Strength Florinal  or Florinal with Codeine Panwarfarin   Buf-Tabs Flurbiprofen Penicillamine   Butalbital Compound Four-way cold tablets Penicillin   Butazolidin Fragmin Pepto-Bismol   Carbenicillin Geminisyn Percodan   Carna Arthritis Reliever Geopen Persantine   Carprofen Gold's salt Persistin   Chloramphenicol Goody's Phenylbutazone   Chloromycetin Haltrain Piroxlcam   Clmetidine heparin Plaquenil   Cllnoril Hyco-pap Ponstel   Clofibrate Hydroxy chloroquine Propoxyphen         Before stopping any of these medications, be sure to consult the physician who ordered them.  Some, such as Coumadin (Warfarin) are ordered to prevent or treat serious conditions such as "deep thrombosis", "pumonary embolisms", and other heart problems.  The amount of time that you may need off of the medication may also vary with the medication and the reason for which you were taking it.  If you are taking any of these medications, please make sure you notify your pain physician before you undergo any procedures.          

## 2015-10-08 ENCOUNTER — Telehealth: Payer: Self-pay

## 2015-10-08 NOTE — Telephone Encounter (Signed)
Called pt- He asked for me to call back so he could cut radio down- called back and pt did not answer- left message

## 2015-10-10 ENCOUNTER — Emergency Department
Admission: EM | Admit: 2015-10-10 | Discharge: 2015-10-10 | Disposition: A | Discharge status: Other Acute Inpatient Hospital | Payer: Medicare Other | Attending: Emergency Medicine | Admitting: Emergency Medicine

## 2015-10-10 ENCOUNTER — Inpatient Hospital Stay
Admission: EM | Admit: 2015-10-10 | Discharge: 2015-10-15 | DRG: 885 | Disposition: A | Discharge status: Home / Self Care | Payer: Medicare Other | Source: Intra-hospital | Attending: Psychiatry | Admitting: Psychiatry

## 2015-10-10 DIAGNOSIS — F17210 Nicotine dependence, cigarettes, uncomplicated: Secondary | ICD-10-CM | POA: Diagnosis not present

## 2015-10-10 DIAGNOSIS — Z833 Family history of diabetes mellitus: Secondary | ICD-10-CM | POA: Diagnosis not present

## 2015-10-10 DIAGNOSIS — G4700 Insomnia, unspecified: Secondary | ICD-10-CM | POA: Diagnosis present

## 2015-10-10 DIAGNOSIS — R45850 Homicidal ideations: Secondary | ICD-10-CM | POA: Diagnosis present

## 2015-10-10 DIAGNOSIS — F1220 Cannabis dependence, uncomplicated: Secondary | ICD-10-CM | POA: Diagnosis present

## 2015-10-10 DIAGNOSIS — K219 Gastro-esophageal reflux disease without esophagitis: Secondary | ICD-10-CM | POA: Diagnosis present

## 2015-10-10 DIAGNOSIS — F332 Major depressive disorder, recurrent severe without psychotic features: Secondary | ICD-10-CM | POA: Diagnosis not present

## 2015-10-10 DIAGNOSIS — F411 Generalized anxiety disorder: Secondary | ICD-10-CM | POA: Diagnosis present

## 2015-10-10 DIAGNOSIS — E559 Vitamin D deficiency, unspecified: Secondary | ICD-10-CM | POA: Diagnosis present

## 2015-10-10 DIAGNOSIS — G8929 Other chronic pain: Secondary | ICD-10-CM | POA: Diagnosis present

## 2015-10-10 DIAGNOSIS — Z79899 Other long term (current) drug therapy: Secondary | ICD-10-CM | POA: Insufficient documentation

## 2015-10-10 DIAGNOSIS — M545 Low back pain, unspecified: Secondary | ICD-10-CM | POA: Diagnosis present

## 2015-10-10 DIAGNOSIS — I10 Essential (primary) hypertension: Secondary | ICD-10-CM | POA: Diagnosis present

## 2015-10-10 DIAGNOSIS — Z818 Family history of other mental and behavioral disorders: Secondary | ICD-10-CM | POA: Diagnosis not present

## 2015-10-10 DIAGNOSIS — Z9889 Other specified postprocedural states: Secondary | ICD-10-CM | POA: Diagnosis not present

## 2015-10-10 DIAGNOSIS — M5137 Other intervertebral disc degeneration, lumbosacral region: Secondary | ICD-10-CM | POA: Insufficient documentation

## 2015-10-10 DIAGNOSIS — F1020 Alcohol dependence, uncomplicated: Secondary | ICD-10-CM | POA: Diagnosis present

## 2015-10-10 DIAGNOSIS — M549 Dorsalgia, unspecified: Secondary | ICD-10-CM | POA: Diagnosis not present

## 2015-10-10 DIAGNOSIS — S069XAA Unspecified intracranial injury with loss of consciousness status unknown, initial encounter: Secondary | ICD-10-CM | POA: Diagnosis present

## 2015-10-10 DIAGNOSIS — M1990 Unspecified osteoarthritis, unspecified site: Secondary | ICD-10-CM | POA: Diagnosis present

## 2015-10-10 DIAGNOSIS — Z8249 Family history of ischemic heart disease and other diseases of the circulatory system: Secondary | ICD-10-CM | POA: Diagnosis not present

## 2015-10-10 DIAGNOSIS — Z046 Encounter for general psychiatric examination, requested by authority: Secondary | ICD-10-CM

## 2015-10-10 DIAGNOSIS — F17200 Nicotine dependence, unspecified, uncomplicated: Secondary | ICD-10-CM | POA: Diagnosis present

## 2015-10-10 DIAGNOSIS — M5117 Intervertebral disc disorders with radiculopathy, lumbosacral region: Secondary | ICD-10-CM | POA: Diagnosis present

## 2015-10-10 DIAGNOSIS — M5416 Radiculopathy, lumbar region: Secondary | ICD-10-CM | POA: Diagnosis not present

## 2015-10-10 DIAGNOSIS — Z888 Allergy status to other drugs, medicaments and biological substances status: Secondary | ICD-10-CM | POA: Diagnosis not present

## 2015-10-10 DIAGNOSIS — S069X9A Unspecified intracranial injury with loss of consciousness of unspecified duration, initial encounter: Secondary | ICD-10-CM | POA: Diagnosis present

## 2015-10-10 LAB — URINE DRUG SCREEN, QUALITATIVE (ARMC ONLY)
Amphetamines, Ur Screen: NOT DETECTED
Barbiturates, Ur Screen: NOT DETECTED
Benzodiazepine, Ur Scrn: NOT DETECTED
CANNABINOID 50 NG, UR ~~LOC~~: POSITIVE — AB
COCAINE METABOLITE, UR ~~LOC~~: NOT DETECTED
MDMA (ECSTASY) UR SCREEN: NOT DETECTED
Methadone Scn, Ur: NOT DETECTED
OPIATE, UR SCREEN: NOT DETECTED
PHENCYCLIDINE (PCP) UR S: NOT DETECTED
Tricyclic, Ur Screen: NOT DETECTED

## 2015-10-10 LAB — COMPREHENSIVE METABOLIC PANEL
ALT: 35 U/L (ref 17–63)
AST: 46 U/L — AB (ref 15–41)
Albumin: 4.7 g/dL (ref 3.5–5.0)
Alkaline Phosphatase: 50 U/L (ref 38–126)
Anion gap: 9 (ref 5–15)
BUN: 10 mg/dL (ref 6–20)
CHLORIDE: 104 mmol/L (ref 101–111)
CO2: 24 mmol/L (ref 22–32)
CREATININE: 0.97 mg/dL (ref 0.61–1.24)
Calcium: 9.6 mg/dL (ref 8.9–10.3)
GFR calc Af Amer: >60 mL/min (ref >60)
GLUCOSE: 113 mg/dL — AB (ref 65–99)
Potassium: 3.9 mmol/L (ref 3.5–5.1)
Sodium: 137 mmol/L (ref 135–145)
Total Bilirubin: 0.4 mg/dL (ref 0.3–1.2)
Total Protein: 7.6 g/dL (ref 6.5–8.1)

## 2015-10-10 LAB — CBC
HEMATOCRIT: 42.8 % (ref 40.0–52.0)
Hemoglobin: 14.7 g/dL (ref 13.0–18.0)
MCH: 32.7 pg (ref 26.0–34.0)
MCHC: 34.3 g/dL (ref 32.0–36.0)
MCV: 95.3 fL (ref 80.0–100.0)
PLATELETS: 305 10*3/uL (ref 150–440)
RBC: 4.49 MIL/uL (ref 4.40–5.90)
RDW: 14 % (ref 11.5–14.5)
WBC: 11 10*3/uL — ABNORMAL HIGH (ref 3.8–10.6)

## 2015-10-10 LAB — ACETAMINOPHEN LEVEL: Acetaminophen (Tylenol), Serum: <10 ug/mL — ABNORMAL LOW (ref 10–30)

## 2015-10-10 LAB — SALICYLATE LEVEL: Salicylate Lvl: <4 mg/dL (ref 2.8–30.0)

## 2015-10-10 LAB — ETHANOL

## 2015-10-10 MED ORDER — DOCUSATE SODIUM 100 MG PO CAPS
100.0000 mg | ORAL_CAPSULE | Freq: Two times a day (BID) | ORAL | Status: DC
Start: 2015-10-10 — End: 2015-10-15
  Administered 2015-10-11 – 2015-10-14 (×8): 100 mg via ORAL
  Filled 2015-10-10 (×8): qty 1

## 2015-10-10 MED ORDER — ACETAMINOPHEN 500 MG PO TABS
ORAL_TABLET | ORAL | Status: AC
  Administered 2015-10-10: 1000 mg via ORAL
  Filled 2015-10-10: qty 2

## 2015-10-10 MED ORDER — NICOTINE 21 MG/24HR TD PT24
21.0000 mg | MEDICATED_PATCH | Freq: Once | TRANSDERMAL | Status: AC
  Administered 2015-10-11: 21 mg via TRANSDERMAL

## 2015-10-10 MED ORDER — NICOTINE 21 MG/24HR TD PT24
MEDICATED_PATCH | TRANSDERMAL | Status: AC
  Administered 2015-10-10: 21 mg via TRANSDERMAL
  Filled 2015-10-10: qty 1

## 2015-10-10 MED ORDER — HYDROCHLOROTHIAZIDE 25 MG PO TABS
25.0000 mg | ORAL_TABLET | Freq: Every day | ORAL | Status: DC
  Administered 2015-10-10 – 2015-10-15 (×6): 25 mg via ORAL
  Filled 2015-10-10 (×6): qty 1

## 2015-10-10 MED ORDER — CHLORDIAZEPOXIDE HCL 25 MG PO CAPS
25.0000 mg | ORAL_CAPSULE | Freq: Four times a day (QID) | ORAL | Status: DC
  Administered 2015-10-10 – 2015-10-13 (×10): 25 mg via ORAL
  Filled 2015-10-10 (×10): qty 1

## 2015-10-10 MED ORDER — HYDROXYZINE HCL 25 MG PO TABS
25.0000 mg | ORAL_TABLET | Freq: Three times a day (TID) | ORAL | Status: DC | PRN
  Administered 2015-10-14 – 2015-10-15 (×2): 25 mg via ORAL
  Filled 2015-10-10 (×2): qty 1

## 2015-10-10 MED ORDER — PANTOPRAZOLE SODIUM 40 MG PO TBEC
40.0000 mg | DELAYED_RELEASE_TABLET | Freq: Every day | ORAL | Status: DC
  Administered 2015-10-10 – 2015-10-15 (×6): 40 mg via ORAL
  Filled 2015-10-10 (×6): qty 1

## 2015-10-10 MED ORDER — OXYCODONE HCL 5 MG PO TABS
5.0000 mg | ORAL_TABLET | Freq: Four times a day (QID) | ORAL | Status: DC | PRN
  Administered 2015-10-10 – 2015-10-15 (×9): 5 mg via ORAL
  Filled 2015-10-10 (×9): qty 1

## 2015-10-10 MED ORDER — ACETAMINOPHEN 325 MG PO TABS
650.0000 mg | ORAL_TABLET | Freq: Four times a day (QID) | ORAL | Status: DC | PRN
  Administered 2015-10-11 – 2015-10-14 (×5): 650 mg via ORAL
  Filled 2015-10-10 (×5): qty 2

## 2015-10-10 MED ORDER — SERTRALINE HCL 100 MG PO TABS
100.0000 mg | ORAL_TABLET | Freq: Every day | ORAL | Status: DC
Start: 2015-10-10 — End: 2015-10-15
  Administered 2015-10-10 – 2015-10-15 (×6): 100 mg via ORAL
  Filled 2015-10-10 (×7): qty 1

## 2015-10-10 MED ORDER — ALUM & MAG HYDROXIDE-SIMETH 200-200-20 MG/5ML PO SUSP: 30.0000 mL | ORAL | Status: DC | PRN

## 2015-10-10 MED ORDER — VITAMIN D (ERGOCALCIFEROL) 1.25 MG (50000 UNIT) PO CAPS
50000.0000 [IU] | ORAL_CAPSULE | ORAL | Status: DC
  Administered 2015-10-11: 50000 [IU] via ORAL
  Filled 2015-10-10: qty 1

## 2015-10-10 MED ORDER — MAGNESIUM HYDROXIDE 400 MG/5ML PO SUSP: 30.0000 mL | Freq: Every day | ORAL | Status: DC | PRN

## 2015-10-10 MED ORDER — ACETAMINOPHEN 500 MG PO TABS
1000.0000 mg | ORAL_TABLET | Freq: Once | ORAL | Status: AC
  Administered 2015-10-10: 1000 mg via ORAL

## 2015-10-10 MED ORDER — LOSARTAN POTASSIUM 50 MG PO TABS
50.0000 mg | ORAL_TABLET | Freq: Every day | ORAL | Status: DC
  Administered 2015-10-10 – 2015-10-15 (×6): 50 mg via ORAL
  Filled 2015-10-10 (×6): qty 1

## 2015-10-10 MED ORDER — NICOTINE 21 MG/24HR TD PT24
21.0000 mg | MEDICATED_PATCH | Freq: Once | TRANSDERMAL | Status: DC
  Administered 2015-10-10: 21 mg via TRANSDERMAL

## 2015-10-10 MED ORDER — TRAZODONE HCL 100 MG PO TABS
100.0000 mg | ORAL_TABLET | Freq: Every day | ORAL | Status: DC
  Administered 2015-10-10 – 2015-10-14 (×5): 100 mg via ORAL
  Filled 2015-10-10 (×5): qty 1

## 2015-10-10 NOTE — ED Notes (Signed)
Unable to provide urine sample at this time

## 2015-10-10 NOTE — BH Assessment (Signed)
Patient is to be admitted to [facility] by Dr. Bary Leriche.  Attending Physician will be Dr. Bary Leriche.   Patient has been assigned to room 305, by [facility].   Intake Paper Work has been signed and placed on patient chart.  ER staff is aware of the admission Legrand Pitts, ER Sect.; Dr. Jacqualine Code,, ER MD; Gwynn Burly., Patient's Nurse & Verdis Frederickson, Patient Access).

## 2015-10-10 NOTE — Tx Team (Addendum)
Initial Interdisciplinary Treatment Plan   PATIENT STRESSORS: Substance abuse   PATIENT STRENGTHS: Communication skills General fund of knowledge   PROBLEM LIST: Problem List/Patient Goals Date to be addressed Date deferred Reason deferred Estimated date of resolution  Substance abuse 10/10/15     Homicidal ideation 10/10/15           "I'm just here to clear my head" 10/10/15     "Trying not to get mad as often as I do" 10/10/15                              DISCHARGE CRITERIA:  Adequate post-discharge living arrangements Improved stabilization in mood, thinking, and/or behavior Motivation to continue treatment in a less acute level of care Need for constant or close observation no longer present Verbal commitment to aftercare and medication compliance Withdrawal symptoms are absent or subacute and managed without 24-hour nursing intervention  PRELIMINARY DISCHARGE PLAN: Attend 12-step recovery group Outpatient therapy Placement in alternative living arrangements  PATIENT/FAMIILY INVOLVEMENT: This treatment plan has been presented to and reviewed with the patient, [name], and/or family member.  The patient and family have been given the opportunity to ask questions and make suggestions.  Caryl Manas L Parsells 10/10/2015, 11:32 PM
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
## 2015-10-10 NOTE — ED Notes (Signed)
BEHAVIORAL HEALTH ROUNDING Patient sleeping: No. Patient alert and oriented: yes Behavior appropriate: Yes.  ; If no, describe:  Nutrition and fluids offered: yes Toileting and hygiene offered: Yes  Sitter present: q15 minute observations and security  monitoring Law enforcement present: Yes  ODS  

## 2015-10-10 NOTE — ED Notes (Addendum)
He is to be admitted to [facility]  No bed has been assigned at this time   Pt observed with no unusual behavior  Appropriate to stimulation  No verbalized needs or concerns at this time  NAD assessed  Continue to monitor

## 2015-10-10 NOTE — Consult Note (Signed)
  The patient meets criteria for psychiatric admission. I will write admission orders. See H&P for details.

## 2015-10-10 NOTE — ED Notes (Signed)

## 2015-10-10 NOTE — ED Notes (Signed)
Pt given lunch tray.

## 2015-10-10 NOTE — ED Notes (Signed)
Report called to lower level Patent attorney, Sharyn Lull.
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
## 2015-10-11 DIAGNOSIS — S069X9A Unspecified intracranial injury with loss of consciousness of unspecified duration, initial encounter: Secondary | ICD-10-CM | POA: Diagnosis present

## 2015-10-11 DIAGNOSIS — F332 Major depressive disorder, recurrent severe without psychotic features: Principal | ICD-10-CM

## 2015-10-11 DIAGNOSIS — S069XAA Unspecified intracranial injury with loss of consciousness status unknown, initial encounter: Secondary | ICD-10-CM | POA: Diagnosis present

## 2015-10-11 NOTE — Progress Notes (Signed)
Recreation Therapy Notes  Date: 06.08.17 Time: 1:00 pm Location: Craft Room  Group Topic: Leisure Education  Goal Area(s) Addresses:  Patient will identify things they are grateful for. Patient will identify how being grateful can influence decision making.  Behavioral Response: Attentive  Intervention: Immunologist  Activity: Patients were given an I Am Grateful For worksheet and instructed to write things they are grateful for under each category.  Education: LRT educated patients on why it is important to be grateful.  Education Outcome: In group clarification offered   Clinical Observations/Feedback: Patient did not participate in group activity. Patient did not contribute to group discussion.  Leonette [facility], LRT/CTRS 10/11/2015 2:22 PM

## 2015-10-11 NOTE — Progress Notes (Signed)
D: Patient remained  Close to his room . Voice to Probation officer he is detox ing and being irritable .   From Alcohol . Writer observed  Mild tremors . Patient  Out of room for meals .  Patient noted very guarded  No ADL's  Or personal hygiene . Affect flat and depressed.   Patient stated slept good last night .Stated appetite is good and energy level  Is normal. Stated concentration is good . Stated on Depression scale 4 , hopeless 0 and anxiety 0 .( low 0-10 high) Denies suicidal  homicidal ideations  .  No auditory hallucinations  No pain concerns . Appropriate ADL'S. Interacting with peers and staff.  A: Encourage patient participation with unit programming . Instruction  Given on  Medication , verbalize understanding. R: Voice no other concerns. Staff continue to monitor

## 2015-10-11 NOTE — [facility] Group Notes (Signed)
[place] LCSW Group Therapy   10/11/2015 9:30 am  Type of Therapy: Group Therapy   Participation Level: Did Not Attend. Patient invited to participate but declined.    Alphonse Guild. Ritesh Opara, MSW, LCSWA, LCAS

## 2015-10-11 NOTE — [facility] Group Notes (Signed)
[place] Group Notes:  (Nursing/MHT/Case Management/Adjunct)  Date:  10/11/2015  Time:  6:09 PM  Type of Therapy:  Psychoeducational Skills  Participation Level:  Active  Participation Quality:  Appropriate, Sharing and Supportive  Affect:  Appropriate  Cognitive:  Alert and Appropriate  Insight:  Appropriate and Good  Engagement in Group:  Engaged and Supportive  Modes of Intervention:  Education, Exploration and Support  Summary of Progress/Problems:  [employer] 10/11/2015, 6:09 PM

## 2015-10-11 NOTE — BHH Suicide Risk Assessment (Signed)
[facility] Admission Suicide Risk Assessment   Nursing information obtained from:  Patient, Review of record Demographic factors:  Male, Low socioeconomic status Current Mental Status:  Thoughts of violence towards others Loss Factors:  NA Historical Factors:  NA Risk Reduction Factors:  NA  Total Time spent with patient: 1 hour Principal Problem: <principal problem not specified> Diagnosis:   Patient Active Problem List   Diagnosis Date Noted  . Major depressive disorder, recurrent severe without psychotic features ([place]) [F33.2] 10/10/2015  . Cannabis use disorder, moderate, dependence ([place]) [F12.20] 10/10/2015  . Alcohol use disorder, severe, dependence ([place]) [F10.20] 10/10/2015  . Homicidal ideation [R45.850] 10/10/2015  . Involuntary commitment [Z04.6] 10/10/2015  . Tobacco use disorder [F17.200] 10/10/2015  . Back pain at L4-L5 level [M54.5] 05/16/2015  . DDD (degenerative disc disease), lumbosacral [M51.37] 05/16/2015  . Lumbar radiculopathy [M54.16] 05/16/2015  . Arthropathy of left hip [M12.9] 05/16/2015   Subjective Data: Depression, alcoholism, homicidal ideation.  Continued Clinical Symptoms:  Alcohol Use Disorder Identification Test Final Score (AUDIT): 38 The "Alcohol Use Disorders Identification Test", Guidelines for Use in Primary Care, Second Edition.  World Pharmacologist [facility]). Score between 0-7:  no or low risk or alcohol related problems. Score between 8-15:  moderate risk of alcohol related problems. Score between 16-19:  high risk of alcohol related problems. Score 20 or above:  warrants further diagnostic evaluation for alcohol dependence and treatment.   CLINICAL FACTORS:   Depression:   Comorbid alcohol abuse/dependence Impulsivity Alcohol/Substance Abuse/Dependencies   Musculoskeletal: Strength & Muscle Tone: within normal limits Gait & Station: normal Patient leans: N/A  Psychiatric Specialty Exam: Physical Exam  Nursing note and vitals  reviewed.   Review of Systems  Psychiatric/Behavioral: Positive for depression and substance abuse.  All other systems reviewed and are negative.   Blood pressure 132/87, pulse 65, temperature 97.8 F (36.6 C), temperature source Oral, resp. rate 18, height 6\' 1"  (1.854 m), weight 74.844 kg (165 lb).Body mass index is 21.77 kg/(m^2).  General Appearance: Casual  Eye Contact:  Good  Speech:  Clear and Coherent  Volume:  Normal  Mood:  Anxious  Affect:  Appropriate  Thought Process:  Goal Directed  Orientation:  Full (Time, Place, and Person)  Thought Content:  WDL  Suicidal Thoughts:  No  Homicidal Thoughts:  Yes.  with intent/plan  Memory:  Immediate;   Fair Recent;   Fair Remote;   Fair  Judgement:  Poor  Insight:  Shallow  Psychomotor Activity:  Normal  Concentration:  Concentration: Fair and Attention Span: Fair  Recall:  [employer] of Knowledge:  Fair  Language:  Fair  Akathisia:  No  Handed:  Right  AIMS (if indicated):     Assets:  Communication Skills Desire for Improvement Financial Resources/Insurance Housing Physical Health Resilience Social Support  ADL's:  Intact  Cognition:  WNL  Sleep:  Number of Hours: 7      COGNITIVE FEATURES THAT CONTRIBUTE TO RISK:  None    SUICIDE RISK:   Moderate:  Frequent suicidal ideation with limited intensity, and duration, some specificity in terms of plans, no associated intent, good self-control, limited dysphoria/symptomatology, some risk factors present, and identifiable protective factors, including available and accessible social support.  PLAN OF CARE: Hospital admission, medication management, substance abuse counseling, discharge planning.  Mr. Basaldua is a 55 year old male with a history of depression, mood instability, and alcoholism admitted for worsening of depression and homicidal ideation in the context of relationship problems.  1. Homicidal  ideation. The patient only has passing thoughts of hurting his  housemate. He is able to contract for safety in the hospital.  2. Mood. We will continue Zoloft.  3. Alcohol abuse. We offered brief Librium taper.  4. Substance abuse treatment. The patient declined residential treatment. He is open to SA IOP.  5. Hypertension. We will continue losartan and hydrochlorothiazide.  6. GERD. We will continue Protonix.  7. Insomnia. Trazodone is available.  8. Anxiety. Hydroxyzine is available.  9. Smoking. Nicotine patch is available.  10. Chronic pain. Will continue oxycodone as prescribed by pain clinic.  11. Vitamin D deficiency. We continue weekly vitamin D.  12. Disposition. He will be likely discharged back to his boarding house. He refuses [place] placement. He will follow up with [facility] for medication management and substance abuse treatment.   I certify that inpatient services furnished can reasonably be expected to improve the patient's condition.   Orson Slick, MD 10/11/2015, 12:29 PM

## 2015-10-11 NOTE — BHH Group Notes (Signed)
[facility] Group Notes:  (Nursing/MHT/Case Management/Adjunct)  Date:  10/11/2015  Time:  11:36 PM  Type of Therapy:  Psychoeducational Skills  Participation Level:  Active  Participation Quality:  Appropriate  Affect:  Appropriate  Cognitive:  Appropriate  Insight:  Appropriate  Engagement in Group:  Engaged  Modes of Intervention:  Discussion, Socialization and Support  Summary of Progress/Problems:  Reece Agar 10/11/2015, 11:36 PM
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
## 2015-10-11 NOTE — Plan of Care (Signed)
Problem: Education: Goal: Knowledge of [place] [facility] Education information/materials will improve Outcome: Not Progressing Patient  Voice of being tired and sleepy  Needing to rest .
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
## 2015-10-11 NOTE — Progress Notes (Signed)
NUTRITION ASSESSMENT  Pt identified as at risk on the Malnutrition Screen Tool  INTERVENTION: 1. Encourage  the importance of nutrition and intake of food and beverages. 2. Encourage menu completion to best meet pt preferences  NUTRITION DIAGNOSIS: Unintentional weight loss related to sub-optimal intake as evidenced by pt report.   Goal: Pt to meet >/= 90% of their estimated nutrition needs.  Monitor:  PO intake  Assessment:   55 y.o. male admitted with depression  Height: Ht Readings from Last 1 Encounters:  10/10/15 6\' 1"  (1.854 m)    Weight: Wt Readings from Last 1 Encounters:  10/10/15 165 lb (74.844 kg)    Weight Hx: Wt Readings from Last 10 Encounters:  10/10/15 165 lb (74.844 kg)  10/10/15 165 lb (74.844 kg)  10/05/15 165 lb (74.844 kg)  09/14/15 165 lb (74.844 kg)  08/07/15 165 lb (74.844 kg)  07/10/15 165 lb (74.844 kg)  05/16/15 164 lb (74.39 kg)  12/29/14 165 lb (74.844 kg)    BMI:  Body mass index is 21.77 kg/(m^2).   Estimated Nutritional Needs: Kcal: 25-30 kcal/kg Protein: > 1 gram protein/kg Fluid: 1 ml/kcal  Diet Order: Diet regular Room service appropriate?: No; Fluid consistency:: Thin Pt is also offered choice of unit snacks mid-morning and mid-afternoon.  Pt is eating as desired. Recorded po intake 100%   Lab results and medications reviewed.   Kerman Passey [place], [place], LDN [phone] Pager  [phone] Weekend/On-Call Pager

## 2015-10-12 MED ORDER — NICOTINE 21 MG/24HR TD PT24
21.0000 mg | MEDICATED_PATCH | Freq: Every day | TRANSDERMAL | Status: DC
  Administered 2015-10-12 – 2015-10-14 (×3): 21 mg via TRANSDERMAL
  Filled 2015-10-12 (×3): qty 1

## 2015-10-12 NOTE — Progress Notes (Signed)
Recreation Therapy Notes  Date: 06.09.17 Time: 9:30 am Location: Craft Room  Group Topic: Coping Skills  Goal Area(s) Addresses:  Patient will participate in coping skill. Patient will verbalize benefit of art as a coping skill.  Behavioral Response: Attentive, Interactive  Intervention: Coloring  Activity: Patients were given coloring sheets to color and were asked to think about the emotions they were feeling and what their mind was focused on.  Education: LRT educated patients on healthy coping skills.   Education Outcome: Acknowledges education/In group clarification offered   Clinical Observations/Feedback: Patient did not participate in group activity. Patient talked with LRT about coping skills. Patient listened to peer complain and tried to give peer suggestions on how to handle the situation. Patient did not contribute to group discussion.  Leonette Monarch, LRT/CTRS 10/12/2015 10:26 AM
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
## 2015-10-12 NOTE — Progress Notes (Signed)
D: Observed pt in dayroom interacting with peers. Patient alert and oriented x4. Patient denies SI/HI/AVH. Pt affect is anxious and sad. Pt rated depression 4/10 and anxiety 0/10. Pt discussed drinking 24 cans a beer daily as a coping mechanism to "deal with stuff at the boarding house." Pt endorsed wanting to leave boarding house and find and apartment. Pt indicated his goal for treatment is "get control of hostility level." Pt endorsed wanting help for alcohol abuse.  A: Offered active listening and support. Provided therapeutic communication. Administered scheduled medications.  R: Pt pleasant and cooperative. Pt medication compliant. Will continue Q15 min. checks. Safety maintained.

## 2015-10-12 NOTE — Plan of Care (Signed)
Problem: [facility] Participation in Recreation Therapeutic Interventions Goal: STG-Patient will demonstrate improved self esteem by identif STG: Self-Esteem - Within 4 treatment sessions, patient will verbalize at least 5 positive affirmation statements in each of 2 treatment sessions to increase self-esteem post d/c.  Outcome: Progressing Treatment Session 1; Completed 1 out of 2: At approximately 2:55 pm, LRT met with patient in craft room. Patient verbalized 5 positive affirmation statements. Patient reported it felt "strange". LRT encouraged patient to continue saying positive affirmation statements.  Leonette Monarch, LRT/CTRS 06.09.17 3:44 pm Goal: STG-Other Recreation Therapy Goal (Specify) STG: Stress Management - Within 4 treatment sessions, patient will verbalize understanding of the stress management techniques in each of 2 treatment sessions to increase stress management skills post d/c.  Outcome: Progressing Treatment Session 1; Completed 1 out of 2: At approximately 2:55 pm, LRT met with patient in craft room. LRT educated and provided patient with handouts on stress management techniques. Patient verbalized understanding. LRT encouraged patient to read over and practice the stress management techniques.  Leonette Monarch, LRT/CTRS 06.09.17 3:45 pm

## 2015-10-12 NOTE — [facility] Group Notes (Signed)
[facility] Group Notes:  (Nursing/MHT/Case Management/Adjunct)  Date:  10/12/2015  Time:  4:05 PM  Type of Therapy:  Psychoeducational Skills  Participation Level:  Active  Participation Quality:  Appropriate  Affect:  Appropriate  Cognitive:  Appropriate  Insight:  Appropriate  Engagement in Group:  Engaged  Modes of Intervention:  Discussion and Education  Summary of Progress/Problems:  Drake Leach 10/12/2015, 4:05 PM

## 2015-10-12 NOTE — [facility] Group Notes (Signed)
[facility] LCSW Group Therapy   10/12/2015 1:15 PM   Type of Therapy: Group Therapy   Participation Level: Active   Participation Quality: Attentive, Sharing and Supportive   Affect: Depressed and Flat   Cognitive: Alert and Oriented   Insight: Developing/Improving and Engaged   Engagement in Therapy: Developing/Improving and Engaged   Modes of Intervention: Clarification, Confrontation, Discussion, Education, Exploration, Limit-setting, Orientation, Problem-solving, Rapport Building, Art therapist, Socialization and Support   Summary of Progress/Problems: The topic for today was feelings about relapse. Pt discussed what relapse prevention is to them and identified triggers that they are on the path to relapse. Pt processed their feeling towards relapse and was able to relate to peers. Pt discussed coping skills that can be used for relapse prevention. Pt was initially clear and articulate in his sharing, but soon lapsed into laughing at innapropriate times.  Pt did not share much as the session progressed but was focused and attentive to the sharing of others.  Pt was polite and cooperative with the CSW and other group members and focused and attentive to the topics discussed, as well.    Alphonse Guild. Nohlan Burdin, MSW, [place], LCAS  10/12/15

## 2015-10-12 NOTE — Plan of Care (Signed)
Problem: Safety: Goal: Periods of time without injury will increase Outcome: Progressing Pt has been without injury.

## 2015-10-13 MED ORDER — CHLORDIAZEPOXIDE HCL 25 MG PO CAPS
25.0000 mg | ORAL_CAPSULE | Freq: Three times a day (TID) | ORAL | Status: DC
  Administered 2015-10-13 – 2015-10-14 (×4): 25 mg via ORAL
  Filled 2015-10-13 (×4): qty 1
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
## 2015-10-13 NOTE — BHH Group Notes (Signed)
[facility] LCSW Group Therapy  10/13/2015 2:46 PM  Type of Therapy: Group Therapy  Participation Level: Minimal  Participation Quality: Attentive  Affect: Appropriate  Cognitive: Alert  Insight: Limited  Engagement in Therapy: Limited  Modes of Intervention: Discussion, Education, Socialization and Support  Summary of Progress/Problems:. Pt will identify unhealthy thoughts and how they impact their emotions and behavior. Pt will be encouraged to discuss these thoughts, emotions and behaviors with the group. Pt attended group and stayed the entire time. Pt sat quietly and listened to group members share.   [place] MSW, [place]  10/13/2015, 2:46 PM
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
## 2015-10-13 NOTE — Progress Notes (Signed)
Pt has been pleasant and cooperative.Pt continues to detox well. Pt has been med compliant And attending all unit activities. Pt denies SI and A/V hallucinations.

## 2015-10-13 NOTE — BHH Group Notes (Signed)
[facility] Group Notes:  (Nursing/MHT/Case Management/Adjunct)  Date:  10/13/2015  Time:  5:21 AM  Type of Therapy:  Psychoeducational Skills  Participation Level:  Did Not Attend   Summary of Progress/Problems:  Michael Patterson 10/13/2015, 5:21 AM

## 2015-10-14 MED ORDER — LOSARTAN POTASSIUM-HCTZ 50-12.5 MG PO TABS: 1.0000 | ORAL_TABLET | Freq: Every day | ORAL | Status: AC

## 2015-10-14 MED ORDER — TRAZODONE HCL 100 MG PO TABS: 100.0000 mg | ORAL_TABLET | Freq: Every day | ORAL | Status: DC

## 2015-10-14 MED ORDER — SERTRALINE HCL 100 MG PO TABS: 100.0000 mg | ORAL_TABLET | Freq: Every day | ORAL | Status: DC

## 2015-10-14 MED ORDER — CHLORDIAZEPOXIDE HCL 25 MG PO CAPS
25.0000 mg | ORAL_CAPSULE | Freq: Two times a day (BID) | ORAL | Status: DC
  Administered 2015-10-14 – 2015-10-15 (×2): 25 mg via ORAL
  Filled 2015-10-14 (×2): qty 1

## 2015-10-14 MED ORDER — HYDROCHLOROTHIAZIDE 25 MG PO TABS: 25.0000 mg | ORAL_TABLET | ORAL | Status: DC

## 2015-10-14 MED ORDER — NAPROXEN 500 MG PO TABS: 500.0000 mg | ORAL_TABLET | Freq: Two times a day (BID) | ORAL | Status: DC

## 2015-10-14 MED ORDER — HYDROXYZINE HCL 25 MG PO TABS: 25.0000 mg | ORAL_TABLET | Freq: Three times a day (TID) | ORAL | Status: AC | PRN
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
## 2015-10-14 NOTE — Progress Notes (Signed)
Pt has been pleasant and cooperative.Pt continues to detox per protocol .Pt noted to be slightly tremulous but denies having withdrawal symptoms.Pt has  been med compliant and attending all unit activities. Pt denies SI and A/V hallucinations.

## 2015-10-14 NOTE — [facility] Group Notes (Signed)
[facility] Group Notes:  (Nursing/MHT/Case Management/Adjunct)  Date:  10/14/2015  Time:  11:57 PM  Type of Therapy:  Group Therapy  Participation Level:  Did Not Attend   Summary of Progress/Problems:  Michael Patterson 10/14/2015, 11:57 PM

## 2015-10-14 NOTE — [facility] Group Notes (Signed)
[facility] LCSW Group Therapy  10/14/2015 2:03 PM  Type of Therapy:  Group Therapy  Participation Level:  Minimal  Participation Quality:  Attentive  Affect:  Appropriate   Cognitive:  Alert  Insight:  Limited  Engagement in Therapy:  Limited  Modes of Intervention:  Discussion, Education, Socialization and Support  Summary of Progress/Problems: Self esteem: Patients discussed self esteem and how it impacts them. They discussed what aspects in their lives has influenced their self esteem. They were challenged to identify changes that are needed in order to improve self esteem.  Pt attended group and stayed the entire time. Pt sat quietly and listened to other group members share.   [place] MSW, [place]  10/14/2015, 2:03 PM

## 2015-10-14 NOTE — [facility] Group Notes (Signed)
[facility] Group Notes:  (Nursing/MHT/Case Management/Adjunct)  Date:  10/14/2015  Time:  5:28 AM  Type of Therapy:  Psychoeducational Skills  Participation Level:  Minimal  Participation Quality:  Attentive  Affect:  Appropriate  Cognitive:  Appropriate  Insight:  None  Engagement in Group:  Lacking  Modes of Intervention:  Discussion, Socialization and Support  Summary of Progress/Problems: Patient did not have a good attitude about participating in group. Reece Agar 10/14/2015, 5:28 AM

## 2015-10-14 NOTE — Progress Notes (Signed)
Pt was slightly agitated after evening group stating " I came here voluntarily but being in a group with these people who talk to themselves isn't helping me. It is irritating. Pt was cooperative with med pass and appropriate with staff. Denies any SI/AVH. Remains safe on unit. Will continue to monitor.

## 2015-10-14 NOTE — BHH Suicide Risk Assessment (Signed)
[place] INPATIENT:  Family/Significant Other Suicide Prevention Education  Suicide Prevention Education:  Education Completed;Tina Environmental consultant (daughter) has been identified by the patient as the family member/significant other with whom the patient will be residing, and identified as the person(s) who will aid the patient in the event of a mental health crisis (suicidal ideations/suicide attempt).  With written consent from the patient, the family member/significant other has been provided the following suicide prevention education, prior to the and/or following the discharge of the patient.  The suicide prevention education provided includes the following:  Suicide risk factors  Suicide prevention and interventions  National Suicide Hotline telephone number  [facility] assessment telephone number  [facility] Emergency Assistance [place] and/or Residential Mobile Crisis Unit telephone number  Request made of family/significant other to:  Remove weapons (e.g., guns, rifles, knives), all items previously/currently identified as safety concern.    Remove drugs/medications (over-the-counter, prescriptions, illicit drugs), all items previously/currently identified as a safety concern.  The family member/significant other verbalizes understanding of the suicide prevention education information provided.  The family member/significant other agrees to remove the items of safety concern listed above.  [place] MSW, [place]  10/14/2015, 2:35 PM

## 2015-10-14 NOTE — BHH Suicide Risk Assessment (Signed)
[facility] Discharge Suicide Risk Assessment   Principal Problem: Major depressive disorder, recurrent severe without psychotic features [facility]) Discharge Diagnoses:  Patient Active Problem List   Diagnosis Date Noted  . TBI (traumatic brain injury) ([place]) [S06.9X9A] 10/11/2015  . Major depressive disorder, recurrent severe without psychotic features ([place]) [F33.2] 10/10/2015  . Cannabis use disorder, moderate, dependence ([place]) [F12.20] 10/10/2015  . Alcohol use disorder, severe, dependence ([place]) [F10.20] 10/10/2015  . Homicidal ideation [R45.850] 10/10/2015  . Involuntary commitment [Z04.6] 10/10/2015  . Tobacco use disorder [F17.200] 10/10/2015  . Back pain at L4-L5 level [M54.5] 05/16/2015  . DDD (degenerative disc disease), lumbosacral [M51.37] 05/16/2015  . Lumbar radiculopathy [M54.16] 05/16/2015  . Arthropathy of left hip [M12.9] 05/16/2015    Total Time spent with patient: 30 minutes  Musculoskeletal: Strength & Muscle Tone: within normal limits Gait & Station: normal Patient leans: N/A  Psychiatric Specialty Exam: Review of Systems  Musculoskeletal: Positive for back pain.  All other systems reviewed and are negative.   Blood pressure 111/76, pulse 68, temperature 97.9 F (36.6 C), temperature source Oral, resp. rate 20, height 6\' 1"  (1.854 m), weight 74.844 kg (165 lb).Body mass index is 21.77 kg/(m^2).  General Appearance: Casual  Eye Contact::  Good  Speech:  Clear and [id number]  Volume:  Normal  Mood:  Euthymic  Affect:  Appropriate  Thought Process:  Goal Directed  Orientation:  Full (Time, Place, and Person)  Thought Content:  WDL  Suicidal Thoughts:  No  Homicidal Thoughts:  No  Memory:  Immediate;   Fair Recent;   Fair Remote;   Fair  Judgement:  Fair  Insight:  Shallow  Psychomotor Activity:  Normal  Concentration:  Fair  Recall:  [employer] of Knowledge:Fair  Language: Fair  Akathisia:  No  Handed:  Right  AIMS (if indicated):     Assets:   Communication Skills Desire for Improvement Resilience Social Support  Sleep:  Number of Hours: 8.15  Cognition: WNL  ADL's:  Intact   Mental Status Per Nursing Assessment::   On Admission:  Thoughts of violence towards others  Demographic Factors:  Male and Low socioeconomic status  Loss Factors: Financial problems/change in socioeconomic status  Historical Factors: Family history of mental illness or substance abuse and Impulsivity  Risk Reduction Factors:   Sense of responsibility to family and Positive social support  Continued Clinical Symptoms:  Depression:   Comorbid alcohol abuse/dependence Impulsivity Alcohol/Substance Abuse/Dependencies  Cognitive Features That Contribute To Risk:  None    Suicide Risk:  Minimal: No identifiable suicidal ideation.  Patients presenting with no risk factors but with morbid ruminations; may be classified as minimal risk based on the severity of the depressive symptoms    Plan Of Care/Follow-up recommendations:  Activity:  as tolerated. Diet:  low sodium heart healthy. Other:  keep follow up appointments.  Orson Slick, MD 10/14/2015, 9:00 PM

## 2015-10-15 NOTE — Progress Notes (Signed)
  [facility] Adult Case Management Discharge Plan :  Will you be returning to the same living situation after discharge:  Yes,    At discharge, do you have transportation home?: Yes,   [facility] peer picked up Do you have the ability to pay for your medications: Yes,     Release of information consent forms completed and in the chart;  Patient's signature needed at discharge.  Patient to Follow up at: Follow-up Information    Follow up with [facility].   Why:  Walk in Monday-Friday for Medication Management follow up. Call for time to meet with Community support team.   Contact information:   [place] [id number] [phone]      Next level of care provider has access to [place] and Suicide Prevention discussed: Yes,     Have you used any form of tobacco in the last 30 days? (Cigarettes, Smokeless Tobacco, Cigars, and/or Pipes): Yes  Has patient been referred to the Quitline?: Patient refused referral  Patient has been referred for addiction treatment: Yes  Kaidance Pantoja, Carloyn Jaeger, MSW, LCSW 10/15/2015, 4:37 PM

## 2015-10-15 NOTE — Progress Notes (Signed)
Pt denies all SI/AVH. States he is looking forward to discharge. Medication compliant.   Did not attend evening group. Voices no additional concerns. Safety maintained.

## 2015-10-15 NOTE — BHH Group Notes (Signed)
[place] LCSW Group Therapy  10/15/2015 10:45 AM  Type of Therapy:  Group Therapy  Participation Level:  Active  Participation Quality:  Attentive  Affect:  Appropriate  Cognitive:  Appropriate  Insight:  Developing/Improving  Engagement in Therapy:  Improving  Modes of Intervention:  Discussion, Education and Exploration  Summary of Progress/Problems:LCSW introduced group rules. LCSW and patients were asked to explore obstacles they face, as a collective group, health issues, unemployment, medications and sleep issues, and anger were reviewed and discussed. This patient was able to discuss helpful measures to reduce sleep and supported peers in the group.Patient required some redirection. He reported that he uses alcohol to cause drowsiness for sleep. He was gently redirected and he expressed he doesn't recommend doing this to his peers.    Enis Slipper M 10/15/2015, 10:45 AM

## 2015-10-15 NOTE — Plan of Care (Signed)
Problem: [facility] Participation in Recreation Therapeutic Interventions Goal: STG-Patient will demonstrate improved self esteem by identif STG: Self-Esteem - Within 4 treatment sessions, patient will verbalize at least 5 positive affirmation statements in each of 2 treatment sessions to increase self-esteem post d/c.  Outcome: Completed/Met Date Met:  10/15/15 Treatment Session 2; Completed 2 out of 2: At approximately 11:10 am, LRT met with patient in craft room. Patient verbalized 5 positive affirmation statements. Patient reported it felt "irregular" but a little bit better than when he said them a couple days ago. LRT encouraged patient to continue saying positive affirmation statements.  Leonette Monarch, LRT/CTRS 06.12.17 11:37 am Goal: STG-Other Recreation Therapy Goal (Specify) STG: Stress Management - Within 4 treatment sessions, patient will verbalize understanding of the stress management techniques in each of 2 treatment sessions to increase stress management skills post d/c.  Outcome: Completed/Met Date Met:  10/15/15 Treatment Session 2; Completed 2 out of 2: At approximately 11:10 am, LRT met with patient in craft room. Patient reported he read over some of the stress management techniques. Patient verbalized understanding. LRT encouraged patient to continue reading and to practice the stress management techniques.  Leonette Monarch, LRT/CTRS 06.12.17 11:39 am

## 2015-10-15 NOTE — Progress Notes (Signed)
Recreation Therapy Notes  INPATIENT RECREATION TR PLAN  Patient Details Name: Michael Patterson MRN: [id number] DOB: [birth year] Today's Date: 10/15/2015  Rec Therapy Plan Is patient appropriate for Therapeutic Recreation?: Yes Treatment times per week: At least once a week TR Treatment/Interventions: 1:1 session, Group participation (Comment) (Appropriate participation in daily recreational therapy tx)  Discharge Criteria Pt will be discharged from therapy if:: Treatment goals are met, Discharged Treatment plan/goals/alternatives discussed and agreed upon by:: Patient/family  Discharge Summary Short term goals set: See Care Plan Short term goals met: Complete Progress toward goals comments: One-to-one attended Which groups?: Coping skills, Leisure education One-to-one attended: Self-esteem, stress management Reason goals not met: N/A Therapeutic equipment acquired: None Reason patient discharged from therapy: Discharge from hospital Pt/family agrees with progress & goals achieved: Yes Date patient discharged from therapy: 10/15/15   Leonette Monarch, LRT/CTRS 10/15/2015, 11:53 AM

## 2015-10-15 NOTE — Progress Notes (Signed)
Patient denies SI/HI, denies A/V hallucinations. Patient verbalizes understanding of discharge instructions, follow up care and prescriptions. Patient given all belongings from  locker. Patient escorted out by staff, transported by family. 

## 2015-10-17 ENCOUNTER — Emergency Department
Admission: EM | Admit: 2015-10-17 | Discharge: 2015-10-18 | Disposition: A | Discharge status: Home / Self Care | Payer: Medicare Other | Attending: Student | Admitting: Student

## 2015-10-17 DIAGNOSIS — M5416 Radiculopathy, lumbar region: Secondary | ICD-10-CM | POA: Diagnosis not present

## 2015-10-17 DIAGNOSIS — Z79899 Other long term (current) drug therapy: Secondary | ICD-10-CM | POA: Insufficient documentation

## 2015-10-17 DIAGNOSIS — M1990 Unspecified osteoarthritis, unspecified site: Secondary | ICD-10-CM | POA: Diagnosis not present

## 2015-10-17 DIAGNOSIS — F332 Major depressive disorder, recurrent severe without psychotic features: Secondary | ICD-10-CM | POA: Diagnosis not present

## 2015-10-17 DIAGNOSIS — Z791 Long term (current) use of non-steroidal anti-inflammatories (NSAID): Secondary | ICD-10-CM | POA: Diagnosis not present

## 2015-10-17 DIAGNOSIS — R319 Hematuria, unspecified: Secondary | ICD-10-CM | POA: Diagnosis present

## 2015-10-17 DIAGNOSIS — F17210 Nicotine dependence, cigarettes, uncomplicated: Secondary | ICD-10-CM | POA: Diagnosis not present

## 2015-10-17 DIAGNOSIS — I10 Essential (primary) hypertension: Secondary | ICD-10-CM | POA: Diagnosis not present

## 2015-10-17 DIAGNOSIS — F10120 Alcohol abuse with intoxication, uncomplicated: Secondary | ICD-10-CM | POA: Insufficient documentation

## 2015-10-17 DIAGNOSIS — M5137 Other intervertebral disc degeneration, lumbosacral region: Secondary | ICD-10-CM | POA: Insufficient documentation

## 2015-10-17 DIAGNOSIS — F10920 Alcohol use, unspecified with intoxication, uncomplicated: Secondary | ICD-10-CM

## 2015-10-17 LAB — URINE DRUG SCREEN, QUALITATIVE (ARMC ONLY)
Amphetamines, Ur Screen: NOT DETECTED
BARBITURATES, UR SCREEN: NOT DETECTED
BENZODIAZEPINE, UR SCRN: POSITIVE — AB
CANNABINOID 50 NG, UR ~~LOC~~: NOT DETECTED
Cocaine Metabolite,Ur ~~LOC~~: POSITIVE — AB
MDMA (Ecstasy)Ur Screen: NOT DETECTED
METHADONE SCREEN, URINE: NOT DETECTED
Opiate, Ur Screen: NOT DETECTED
Phencyclidine (PCP) Ur S: NOT DETECTED
TRICYCLIC, UR SCREEN: NOT DETECTED

## 2015-10-17 LAB — COMPREHENSIVE METABOLIC PANEL
ALBUMIN: 5 g/dL (ref 3.5–5.0)
ALK PHOS: 50 U/L (ref 38–126)
ALT: 28 U/L (ref 17–63)
AST: 33 U/L (ref 15–41)
Anion gap: 13 (ref 5–15)
BILIRUBIN TOTAL: 0.5 mg/dL (ref 0.3–1.2)
BUN: 13 mg/dL (ref 6–20)
CALCIUM: 9.9 mg/dL (ref 8.9–10.3)
CO2: 24 mmol/L (ref 22–32)
Chloride: 101 mmol/L (ref 101–111)
Creatinine, Ser: 1 mg/dL (ref 0.61–1.24)
GFR calc Af Amer: >60 mL/min (ref >60)
GFR calc non Af Amer: >60 mL/min (ref >60)
GLUCOSE: 88 mg/dL (ref 65–99)
Potassium: 3.5 mmol/L (ref 3.5–5.1)
SODIUM: 138 mmol/L (ref 135–145)
TOTAL PROTEIN: 8.2 g/dL — AB (ref 6.5–8.1)

## 2015-10-17 LAB — CBC
HEMATOCRIT: 42.5 % (ref 40.0–52.0)
Hemoglobin: 14.6 g/dL (ref 13.0–18.0)
MCH: 32.9 pg (ref 26.0–34.0)
MCHC: 34.3 g/dL (ref 32.0–36.0)
MCV: 95.8 fL (ref 80.0–100.0)
Platelets: 294 10*3/uL (ref 150–440)
RBC: 4.44 MIL/uL (ref 4.40–5.90)
RDW: 13.7 % (ref 11.5–14.5)
WBC: 10.1 10*3/uL (ref 3.8–10.6)

## 2015-10-17 LAB — URINALYSIS COMPLETE WITH MICROSCOPIC (ARMC ONLY)
BACTERIA UA: NONE SEEN
BILIRUBIN URINE: NEGATIVE
GLUCOSE, UA: NEGATIVE mg/dL
Ketones, ur: NEGATIVE mg/dL
Leukocytes, UA: NEGATIVE
NITRITE: NEGATIVE
PH: 6 (ref 5.0–8.0)
Protein, ur: NEGATIVE mg/dL
SQUAMOUS EPITHELIAL / LPF: NONE SEEN
Specific Gravity, Urine: 1.002 — ABNORMAL LOW (ref 1.005–1.030)
WBC UA: NONE SEEN WBC/hpf (ref 0–5)

## 2015-10-17 LAB — ETHANOL: Alcohol, Ethyl (B): 211 mg/dL — ABNORMAL HIGH (ref <5)

## 2015-10-17 LAB — ACETAMINOPHEN LEVEL: Acetaminophen (Tylenol), Serum: <10 ug/mL — ABNORMAL LOW (ref 10–30)

## 2015-10-17 LAB — SALICYLATE LEVEL: Salicylate Lvl: <4 mg/dL (ref 2.8–30.0)

## 2015-10-17 NOTE — BHH Counselor (Signed)
Michael Patterson, sister [phone]

## 2015-10-17 NOTE — ED Notes (Signed)
Pt here for alcohol detox states has been in detox recently.

## 2015-10-17 NOTE — ED Notes (Signed)
The patient was dressed out into the required purple scrubs. His belongings were placed into a white patient belongings bag with his name written on it with a black sharpie. He was then taken to hallway bed 20 with the use of a wheelchair. His belongs were left with RN V. Caryl Pina at her desk.

## 2015-10-18 NOTE — BHH Counselor (Signed)
Pt presents to the ED for detox from alcohol.  Pt's sister reports that pt continues to drink on a daily basis and needs long term treatment.  Pt was recently discharged from [facility] on 10/15/15 with instructions to follow up with outpatient provider.  Pt has not done so.  Referral packet for detox placement faxed to the following:  [facility]

## 2015-10-18 NOTE — Discharge Instructions (Signed)
Alcohol Intoxication  Alcohol intoxication occurs when you drink enough alcohol that it affects your ability to function. It can be mild or very severe. Drinking a lot of alcohol in a short time is called binge drinking. This can be very harmful. Drinking alcohol can also be more dangerous if you are taking medicines or other drugs. Some of the effects caused by alcohol may include:  · Loss of coordination.  · Changes in mood and behavior.  · Unclear thinking.  · Trouble talking (slurred speech).  · Throwing up (vomiting).  · Confusion.  · Slowed breathing.  · Twitching and shaking (seizures).  · Loss of consciousness.  HOME CARE  · Do not drive after drinking alcohol.  · Drink enough water and fluids to keep your pee (urine) clear or pale yellow. Avoid caffeine.  · Only take medicine as told by your doctor.  GET HELP IF:  · You throw up (vomit) many times.  · You do not feel better after a few days.  · You frequently have alcohol intoxication. Your doctor can help decide if you should see a substance use treatment counselor.  GET HELP RIGHT AWAY IF:  · You become shaky when you stop drinking.  · You have twitching and shaking.  · You throw up blood. It may look bright red or like coffee grounds.  · You notice blood in your poop (bowel movements).  · You become lightheaded or pass out (faint).  MAKE SURE YOU:   · Understand these instructions.  · Will watch your condition.  · Will get help right away if you are not doing well or get worse.     This information is not intended to replace advice given to you by your health care provider. Make sure you discuss any questions you have with your health care provider.     Document Released: 10/08/2007 Document Revised: 12/22/2012 Document Reviewed: 09/24/2012  Elsevier Interactive Patient Education ©2016 Elsevier Inc[employer]

## 2015-10-18 NOTE — ED Notes (Signed)
Pt given bus pass ?

## 2015-10-18 NOTE — ED Provider Notes (Signed)
[facility] Emergency Department Provider Note   ____________________________________________  Time seen: Approximately 8:50 PM  I have reviewed the triage vital signs and the nursing notes.   HISTORY  Chief Complaint Drug / Alcohol Assessment  Caveat-history of present illness and review of systems Limited due to intoxication.  HPI Michael Patterson is a 55 y.o. male history of alcohol abuse disorder, GERD, hypertension, polysubstance abuse who presents for evaluation of alcohol intoxication today with desire for detox, gradual onset, constant, moderate, no modifying factors. No vomiting, diarrhea, fevers, chills, chest pain, difficulty breathing, no suicidal or homicidal ideation.   Past Medical History  Diagnosis Date  . Hypertension   . Arthritis   . GERD (gastroesophageal reflux disease)   . Alcohol abuse     Patient Active Problem List   Diagnosis Date Noted  . TBI (traumatic brain injury) ([place]) 10/11/2015  . Major depressive disorder, recurrent severe without psychotic features ([place]) 10/10/2015  . Cannabis use disorder, moderate, dependence ([place]) 10/10/2015  . Alcohol use disorder, severe, dependence ([place]) 10/10/2015  . Homicidal ideation 10/10/2015  . Involuntary commitment 10/10/2015  . Tobacco use disorder 10/10/2015  . Back pain at L4-L5 level 05/16/2015  . DDD (degenerative disc disease), lumbosacral 05/16/2015  . Lumbar radiculopathy 05/16/2015  . Arthropathy of left hip 05/16/2015    Past Surgical History  Procedure Laterality Date  . Skin graft Left hand  . Hip fracture surgery      Current Outpatient Rx  Name  Route  Sig  Dispense  Refill  . acetaminophen (TYLENOL) 325 MG tablet   Oral   Take by mouth. Reported on 10/05/2015         . docusate sodium (STOOL SOFTENER) 100 MG capsule   Oral   Take by mouth. Reported on 10/05/2015         . hydrochlorothiazide (HYDRODIURIL) 25 MG tablet   Oral   Take 1 tablet (25 mg  total) by mouth 30 (thirty) minutes before procedure. Reported on 10/05/2015   30 tablet   0   . hydrOXYzine (ATARAX/VISTARIL) 25 MG tablet   Oral   Take 1 tablet (25 mg total) by mouth 3 (three) times daily as needed for anxiety.   90 tablet   0   . losartan-hydrochlorothiazide (HYZAAR) 50-12.5 MG tablet   Oral   Take 1 tablet by mouth daily.   30 tablet   0   . naproxen (NAPROSYN) 500 MG tablet   Oral   Take 500 mg by mouth daily. Reported on 10/05/2015         . naproxen (NAPROSYN) 500 MG tablet   Oral   Take 1 tablet (500 mg total) by mouth 2 (two) times daily with a meal. Reported on 10/05/2015   60 tablet   0   . oxyCODONE (OXY IR/ROXICODONE) 5 MG immediate release tablet   Oral   Take 5 mg by mouth. Reported on 10/05/2015         . oxyCODONE-acetaminophen (ROXICET) 5-325 MG tablet   Oral   Take 1 tablet by mouth daily after supper.   21 tablet   0   . ranitidine (ZANTAC) 150 MG capsule   Oral   Take 150 mg by mouth daily. Reported on 10/05/2015         . ranitidine (ZANTAC) 150 MG tablet   Oral   Take 150 mg by mouth.         . sertraline (ZOLOFT) 100 MG tablet  Oral   Take 100 mg by mouth daily. Reported on 10/05/2015         . sertraline (ZOLOFT) 100 MG tablet   Oral   Take 1 tablet (100 mg total) by mouth daily.   30 tablet   0   . traZODone (DESYREL) 100 MG tablet   Oral   Take 1 tablet (100 mg total) by mouth at bedtime.   30 tablet   0   . Vitamin D, Ergocalciferol, (DRISDOL) 50000 units CAPS capsule   Oral   Take by mouth. Reported on 10/05/2015           Allergies Aspirin  Family History  Problem Relation Age of Onset  . Diabetes Mother   . Heart disease Mother   . Hypertension Mother     Social History Social History  Substance Use Topics  . Smoking status: Current Every Day Smoker -- 1.00 packs/day    Types: Cigarettes  . Smokeless tobacco: Never Used  . Alcohol Use: 0.6 oz/week    1 Cans of beer per week    Review  of Systems Constitutional: No fever/chills Eyes: No visual changes. ENT: No sore throat. Cardiovascular: Denies chest pain. Respiratory: Denies shortness of breath. Gastrointestinal: No abdominal pain.  No nausea, no vomiting.  No diarrhea.  No constipation. Genitourinary: Negative for dysuria. Musculoskeletal: Negative for back pain. Skin: Negative for rash. Neurological: Negative for headaches, focal weakness or numbness.  10-point ROS otherwise negative.  ____________________________________________   PHYSICAL EXAM:  VITAL SIGNS: ED Triage Vitals  Enc Vitals Group     BP 10/17/15 2032 121/67 mmHg     Pulse Rate 10/17/15 2032 89     Resp 10/17/15 2032 18     Temp 10/17/15 2032 97.9 F (36.6 C)     Temp Source 10/17/15 2032 Oral     SpO2 10/17/15 2032 97 %     Weight 10/17/15 2032 165 lb (74.844 kg)     Height 10/17/15 2032 6\' 1"  (1.854 m)     Head Cir --      Peak Flow --      Pain Score 10/17/15 2032 9     Pain Loc --      Pain Edu? --      Excl. in [place]? --     Constitutional: Alert and oriented, slurring words, smells of alcohol, follows commands and answers most questions appropriately, in no acute distress. Eyes: Conjunctivae are normal. PERRL. EOMI. Head: Atraumatic. Nose: No congestion/rhinnorhea. Mouth/Throat: Mucous membranes are moist.  Oropharynx non-erythematous. Neck: No stridor.  Cardiovascular: Normal rate, regular rhythm. Grossly normal heart sounds.  Good peripheral circulation. Respiratory: Normal respiratory effort.  No retractions. Lungs CTAB. Gastrointestinal: Soft and nontender. No distention.  No CVA tenderness. Genitourinary: deferred Musculoskeletal: No lower extremity tenderness nor edema.  No joint effusions. Neurologic:  Normal speech and language without dysarthria or aphasia. No gross focal neurologic deficits are appreciated.  Skin:  Skin is warm, dry and intact. No rash noted. Psychiatric: Mood and affect are normal. Speech and  behavior are normal.  ____________________________________________   LABS (all labs ordered are listed, but only abnormal results are displayed)  Labs Reviewed  COMPREHENSIVE METABOLIC PANEL - Abnormal; Notable for the following:    Total Protein 8.2 (*)    All other components within normal limits  ETHANOL - Abnormal; Notable for the following:    Alcohol, Ethyl (B) 211 (*)    All other components within normal limits  URINALYSIS COMPLETEWITH  MICROSCOPIC ([facility] ONLY) - Abnormal; Notable for the following:    Color, Urine COLORLESS (*)    APPearance CLEAR (*)    Specific Gravity, Urine 1.002 (*)    Hgb urine dipstick 1+ (*)    All other components within normal limits  URINE DRUG SCREEN, QUALITATIVE ([facility] ONLY) - Abnormal; Notable for the following:    Cocaine Metabolite,Ur  POSITIVE (*)    Benzodiazepine, Ur Scrn POSITIVE (*)    All other components within normal limits  ACETAMINOPHEN LEVEL - Abnormal; Notable for the following:    Acetaminophen (Tylenol), Serum <10 (*)    All other components within normal limits  CBC  SALICYLATE LEVEL   ____________________________________________  EKG  none ____________________________________________  RADIOLOGY  none ____________________________________________   PROCEDURES  Procedure(s) performed: None  Critical Care performed: No  ____________________________________________   INITIAL IMPRESSION / ASSESSMENT AND PLAN / ED COURSE  Pertinent labs & imaging results that were available during my care of the patient were reviewed by me and considered in my medical decision making (see chart for details).  Michael Patterson is a 55 y.o. male history of alcohol abuse disorder, GERD, hypertension, polysubstance abuse who presents for evaluation of alcohol intoxication today with desire for detox. On exam, he is nontoxic appearing in no acute distress. His vital signs are stable, he is afebrile. He has a nonfocal neurological  examination. He does appear intoxicated. I reviewed his labs. CBC and CMP are unremarkable. Alcohol level was elevated at 211 on arrival. Urinalysis is not consistent with infection, microhematuria is noted. Urine drug screen positive for cocaine and benzodiazepines. Undetectable acetaminophen and salicylate levels. Will consult TTS. ____________________________________________   FINAL CLINICAL IMPRESSION(S) / ED DIAGNOSES  Final diagnoses:  Alcohol intoxication, uncomplicated ([place])      NEW MEDICATIONS STARTED DURING THIS VISIT:  New Prescriptions   No medications on file     Note:  This document was prepared using Dragon voice recognition software and may include unintentional dictation errors.    Joanne Gavel, MD 10/18/15 [phone]

## 2015-10-18 NOTE — ED Notes (Signed)
Pt unable to sleep in hallway, pt moved to RM 21. Pt states he is more comfortable and feels he can sleep now.

## 2015-11-02 ENCOUNTER — Encounter: Payer: Self-pay | Admitting: Anesthesiology

## 2015-11-02 ENCOUNTER — Ambulatory Visit: Payer: Medicare Other | Attending: Anesthesiology | Admitting: Anesthesiology

## 2015-11-02 VITALS — BP 133/78 | HR 55 | Temp 97.8°F | Resp 18 | Ht 73.0 in | Wt 165.0 lb

## 2015-11-02 DIAGNOSIS — G8929 Other chronic pain: Secondary | ICD-10-CM | POA: Diagnosis not present

## 2015-11-02 DIAGNOSIS — M5137 Other intervertebral disc degeneration, lumbosacral region: Secondary | ICD-10-CM | POA: Insufficient documentation

## 2015-11-02 DIAGNOSIS — M5442 Lumbago with sciatica, left side: Secondary | ICD-10-CM | POA: Diagnosis not present

## 2015-11-02 DIAGNOSIS — M545 Low back pain, unspecified: Secondary | ICD-10-CM

## 2015-11-02 DIAGNOSIS — M5116 Intervertebral disc disorders with radiculopathy, lumbar region: Secondary | ICD-10-CM | POA: Diagnosis not present

## 2015-11-02 DIAGNOSIS — M5416 Radiculopathy, lumbar region: Secondary | ICD-10-CM

## 2015-11-02 DIAGNOSIS — M5417 Radiculopathy, lumbosacral region: Secondary | ICD-10-CM | POA: Diagnosis not present

## 2015-11-02 MED ORDER — IOPAMIDOL (ISOVUE-M 200) INJECTION 41%
INTRAMUSCULAR | Status: AC
  Administered 2015-11-02: 12:00:00
  Filled 2015-11-02: qty 10

## 2015-11-02 MED ORDER — TRIAMCINOLONE ACETONIDE 40 MG/ML IJ SUSP
INTRAMUSCULAR | Status: AC
  Administered 2015-11-02: 12:00:00
  Filled 2015-11-02: qty 2

## 2015-11-02 MED ORDER — MIDAZOLAM HCL 5 MG/5ML IJ SOLN
INTRAMUSCULAR | Status: AC
  Administered 2015-11-02: 2 mg
  Filled 2015-11-02: qty 5

## 2015-11-02 MED ORDER — FENTANYL CITRATE (PF) 100 MCG/2ML IJ SOLN
25.0000 ug | Freq: Once | INTRAMUSCULAR | Status: AC
  Administered 2015-11-02: 50 ug via INTRAVENOUS
  Filled 2015-11-02: qty 2

## 2015-11-02 MED ORDER — IOPAMIDOL (ISOVUE-300) INJECTION 61%: 10.0000 mL | Freq: Once | INTRAVENOUS | Status: AC | PRN

## 2015-11-02 MED ORDER — BUPIVACAINE HCL (PF) 0.25 % IJ SOLN
10.0000 mL | Freq: Once | INTRAMUSCULAR | Status: AC
  Administered 2015-11-02: 10 mL via EPIDURAL
  Filled 2015-11-02: qty 30

## 2015-11-02 MED ORDER — OXYCODONE-ACETAMINOPHEN 5-325 MG PO TABS: 1.0000 | ORAL_TABLET | ORAL | Status: DC

## 2015-11-02 NOTE — Progress Notes (Signed)
   Subjective:    Patient ID: Michael Patterson, male    DOB: [birth year], 55 y.o.   MRN: [id number]  HPI    Review of Systems     Objective:   Physical Exam        lan:    after the patient receives his caudal epidural steroid injection, was given a r Roxicet 5/325 mg to be t night.  he was given 21 tablets And follow-up with him in 3 weeks   Lance Bosch M.D.

## 2015-11-02 NOTE — Patient Instructions (Signed)
Pain Management Discharge Instructions  General Discharge Instructions :  If you need to reach your doctor call: Monday-Friday 8:00 am - 4:00 pm at [phone] or toll free [phone].  After clinic hours [phone] to have operator reach doctor.  Bring all of your medication bottles to all your appointments in the pain clinic.  To cancel or reschedule your appointment with Pain Management please remember to call 24 hours in advance to avoid a fee.  Refer to the educational materials which you have been given on: General Risks, I had my Procedure. Discharge Instructions, Post Sedation.  Post Procedure Instructions:  The drugs you were given will stay in your system until tomorrow, so for the next 24 hours you should not drive, make any legal decisions or drink any alcoholic beverages.  You may eat anything you prefer, but it is better to start with liquids then soups and crackers, and gradually work up to solid foods.  Please notify your doctor immediately if you have any unusual bleeding, trouble breathing or pain that is not related to your normal pain.  Depending on the type of procedure that was done, some parts of your body may feel week and/or numb.  This usually clears up by tonight or the next day.  Walk with the use of an assistive device or accompanied by an adult for the 24 hours.  You may use ice on the affected area for the first 24 hours.  Put ice in a Ziploc bag and cover with a towel and place against area 15 minutes on 15 minutes off.  You may switch to heat after 24 hours.GENERAL RISKS AND COMPLICATIONS  What are the risk, side effects and possible complications? Generally speaking, most procedures are safe.  However, with any procedure there are risks, side effects, and the possibility of complications.  The risks and complications are dependent upon the sites that are lesioned, or the type of nerve block to be performed.  The closer the procedure is to the spine,  the more serious the risks are.  Great care is taken when placing the radio frequency needles, block needles or lesioning probes, but sometimes complications can occur. 1. Infection: Any time there is an injection through the skin, there is a risk of infection.  This is why sterile conditions are used for these blocks.  There are four possible types of infection. 1. Localized skin infection. 2. Central Nervous System Infection-This can be in the form of Meningitis, which can be deadly. 3. Epidural Infections-This can be in the form of an epidural abscess, which can cause pressure inside of the spine, causing compression of the spinal cord with subsequent paralysis. This would require an emergency surgery to decompress, and there are no guarantees that the patient would recover from the paralysis. 4. Discitis-This is an infection of the intervertebral discs.  It occurs in about 1% of discography procedures.  It is difficult to treat and it may lead to surgery.        2. Pain: the needles have to go through skin and soft tissues, will cause soreness.       3. Damage to internal structures:  The nerves to be lesioned may be near blood vessels or    other nerves which can be potentially damaged.       4. Bleeding: Bleeding is more common if the patient is taking blood thinners such as  aspirin, Coumadin, Ticiid, Plavix, etc., or if he/she have some genetic predisposition  such as   hemophilia. Bleeding into the spinal canal can cause compression of the spinal  cord with subsequent paralysis.  This would require an emergency surgery to  decompress and there are no guarantees that the patient would recover from the  paralysis.       5. Pneumothorax:  Puncturing of a lung is a possibility, every time a needle is introduced in  the area of the chest or upper back.  Pneumothorax refers to free air around the  collapsed lung(s), inside of the thoracic cavity (chest cavity).  Another two possible  complications  related to a similar event would include: Hemothorax and Chylothorax.   These are variations of the Pneumothorax, where instead of air around the collapsed  lung(s), you may have blood or chyle, respectively.       6. Spinal headaches: They may occur with any procedures in the area of the spine.       7. Persistent CSF (Cerebro-Spinal Fluid) leakage: This is a rare problem, but may occur  with prolonged intrathecal or epidural catheters either due to the formation of a fistulous  track or a dural tear.       8. Nerve damage: By working so close to the spinal cord, there is always a possibility of  nerve damage, which could be as serious as a permanent spinal cord injury with  paralysis.       9. Death:  Although rare, severe deadly allergic reactions known as "Anaphylactic  reaction" can occur to any of the medications used.      10. Worsening of the symptoms:  We can always make thing worse.  What are the chances of something like this happening? Chances of any of this occuring are extremely low.  By statistics, you have more of a chance of getting killed in a motor vehicle accident: while driving to the hospital than any of the above occurring .  Nevertheless, you should be aware that they are possibilities.  In general, it is similar to taking a shower.  Everybody knows that you can slip, hit your head and get killed.  Does that mean that you should not shower again?  Nevertheless always keep in mind that statistics do not mean anything if you happen to be on the wrong side of them.  Even if a procedure has a 1 (one) in a 1,000,000 (million) chance of going wrong, it you happen to be that one..Also, keep in mind that by statistics, you have more of a chance of having something go wrong when taking medications.  Who should not have this procedure? If you are on a blood thinning medication (e.g. Coumadin, Plavix, see list of "Blood Thinners"), or if you have an active infection going on, you should not  have the procedure.  If you are taking any blood thinners, please inform your physician.  How should I prepare for this procedure?  Do not eat or drink anything at least six hours prior to the procedure.  Bring a driver with you .  It cannot be a taxi.  Come accompanied by an adult that can drive you back, and that is strong enough to help you if your legs get weak or numb from the local anesthetic.  Take all of your medicines the morning of the procedure with just enough water to swallow them.  If you have diabetes, make sure that you are scheduled to have your procedure done first thing in the morning, whenever possible.  If you have diabetes,   take only half of your insulin dose and notify our nurse that you have done so as soon as you arrive at the clinic.  If you are diabetic, but only take blood sugar pills (oral hypoglycemic), then do not take them on the morning of your procedure.  You may take them after you have had the procedure.  Do not take aspirin or any aspirin-containing medications, at least eleven (11) days prior to the procedure.  They may prolong bleeding.  Wear loose fitting clothing that may be easy to take off and that you would not mind if it got stained with Betadine or blood.  Do not wear any jewelry or perfume  Remove any nail coloring.  It will interfere with some of our monitoring equipment.  NOTE: Remember that this is not meant to be interpreted as a complete list of all possible complications.  Unforeseen problems may occur.  BLOOD THINNERS The following drugs contain aspirin or other products, which can cause increased bleeding during surgery and should not be taken for 2 weeks prior to and 1 week after surgery.  If you should need take something for relief of minor pain, you may take acetaminophen which is found in Tylenol,m Datril, Anacin-3 and Panadol. It is not blood thinner. The products listed below are.  Do not take any of the products listed below  in addition to any listed on your instruction sheet.  A.P.C or A.P.C with Codeine Codeine Phosphate Capsules #3 Ibuprofen Ridaura  ABC compound Congesprin Imuran rimadil  Advil Cope Indocin Robaxisal  Alka-Seltzer Effervescent Pain Reliever and Antacid Coricidin or Coricidin-D  Indomethacin Rufen  Alka-Seltzer plus Cold Medicine Cosprin Ketoprofen S-A-C Tablets  Anacin Analgesic Tablets or Capsules Coumadin Korlgesic Salflex  Anacin Extra Strength Analgesic tablets or capsules CP-2 Tablets Lanoril Salicylate  Anaprox Cuprimine Capsules Levenox Salocol  Anexsia-D Dalteparin Magan Salsalate  Anodynos Darvon compound Magnesium Salicylate Sine-off  Ansaid Dasin Capsules Magsal Sodium Salicylate  Anturane Depen Capsules Marnal Soma  APF Arthritis pain formula Dewitt's Pills Measurin Stanback  Argesic Dia-Gesic Meclofenamic Sulfinpyrazone  Arthritis Bayer Timed Release Aspirin Diclofenac Meclomen Sulindac  Arthritis pain formula Anacin Dicumarol Medipren Supac  Analgesic (Safety coated) Arthralgen Diffunasal Mefanamic Suprofen  Arthritis Strength Bufferin Dihydrocodeine Mepro Compound Suprol  Arthropan liquid Dopirydamole Methcarbomol with Aspirin Synalgos  ASA tablets/Enseals Disalcid Micrainin Tagament  Ascriptin Doan's Midol Talwin  Ascriptin A/D Dolene Mobidin Tanderil  Ascriptin Extra Strength Dolobid Moblgesic Ticlid  Ascriptin with Codeine Doloprin or Doloprin with Codeine Momentum Tolectin  Asperbuf Duoprin Mono-gesic Trendar  Aspergum Duradyne Motrin or Motrin IB Triminicin  Aspirin plain, buffered or enteric coated Durasal Myochrisine Trigesic  Aspirin Suppositories Easprin Nalfon Trillsate  Aspirin with Codeine Ecotrin Regular or Extra Strength Naprosyn Uracel  Atromid-S Efficin Naproxen Ursinus  Auranofin Capsules Elmiron Neocylate Vanquish  Axotal Emagrin Norgesic Verin  Azathioprine Empirin or Empirin with Codeine Normiflo Vitamin E  Azolid Emprazil Nuprin Voltaren  Bayer  Aspirin plain, buffered or children's or timed BC Tablets or powders Encaprin Orgaran Warfarin Sodium  Buff-a-Comp Enoxaparin Orudis Zorpin  Buff-a-Comp with Codeine Equegesic Os-Cal-Gesic   Buffaprin Excedrin plain, buffered or Extra Strength Oxalid   Bufferin Arthritis Strength Feldene Oxphenbutazone   Bufferin plain or Extra Strength Feldene Capsules Oxycodone with Aspirin   Bufferin with Codeine Fenoprofen Fenoprofen Pabalate or Pabalate-SF   Buffets II Flogesic Panagesic   Buffinol plain or Extra Strength Florinal or Florinal with Codeine Panwarfarin   Buf-Tabs Flurbiprofen Penicillamine   Butalbital Compound Four-way cold tablets   Penicillin   Butazolidin Fragmin Pepto-Bismol   Carbenicillin Geminisyn Percodan   Carna Arthritis Reliever Geopen Persantine   Carprofen Gold's salt Persistin   Chloramphenicol Goody's Phenylbutazone   Chloromycetin Haltrain Piroxlcam   Clmetidine heparin Plaquenil   Cllnoril Hyco-pap Ponstel   Clofibrate Hydroxy chloroquine Propoxyphen         Before stopping any of these medications, be sure to consult the physician who ordered them.  Some, such as Coumadin (Warfarin) are ordered to prevent or treat serious conditions such as "deep thrombosis", "pumonary embolisms", and other heart problems.  The amount of time that you may need off of the medication may also vary with the medication and the reason for which you were taking it.  If you are taking any of these medications, please make sure you notify your pain physician before you undergo any procedures.         Epidural Steroid Injection Patient Information  Description: The epidural space surrounds the nerves as they exit the spinal cord.  In some patients, the nerves can be compressed and inflamed by a bulging disc or a tight spinal canal (spinal stenosis).  By injecting steroids into the epidural space, we can bring irritated nerves into direct contact with a potentially helpful medication.   These steroids act directly on the irritated nerves and can reduce swelling and inflammation which often leads to decreased pain.  Epidural steroids may be injected anywhere along the spine and from the neck to the low back depending upon the location of your pain.   After numbing the skin with local anesthetic (like Novocaine), a small needle is passed into the epidural space slowly.  You may experience a sensation of pressure while this is being done.  The entire block usually last less than 10 minutes.  Conditions which may be treated by epidural steroids:   Low back and leg pain  Neck and arm pain  Spinal stenosis  Post-laminectomy syndrome  Herpes zoster (shingles) pain  Pain from compression fractures  Preparation for the injection:  1. Do not eat any solid food or dairy products within 8 hours of your appointment.  2. You may drink clear liquids up to 3 hours before appointment.  Clear liquids include water, black coffee, juice or soda.  No milk or cream please. 3. You may take your regular medication, including pain medications, with a sip of water before your appointment  Diabetics should hold regular insulin (if taken separately) and take 1/2 normal NPH dos the morning of the procedure.  Carry some sugar containing items with you to your appointment. 4. A driver must accompany you and be prepared to drive you home after your procedure.  5. Bring all your current medications with your. 6. An IV may be inserted and sedation may be given at the discretion of the physician.   7. A blood pressure cuff, EKG and other monitors will often be applied during the procedure.  Some patients may need to have extra oxygen administered for a short period. 8. You will be asked to provide medical information, including your allergies, prior to the procedure.  We must know immediately if you are taking blood thinners (like Coumadin/Warfarin)  Or if you are allergic to IV iodine contrast (dye). We  must know if you could possible be pregnant.  Possible side-effects:  Bleeding from needle site  Infection (rare, may require surgery)  Nerve injury (rare)  Numbness & tingling (temporary)  Difficulty urinating (rare, temporary)  Spinal headache (   a headache worse with upright posture)  Light -headedness (temporary)  Pain at injection site (several days)  Decreased blood pressure (temporary)  Weakness in arm/leg (temporary)  Pressure sensation in back/neck (temporary)  Call if you experience:  Fever/chills associated with headache or increased back/neck pain.  Headache worsened by an upright position.  New onset weakness or numbness of an extremity below the injection site  Hives or difficulty breathing (go to the emergency room)  Inflammation or drainage at the infection site  Severe back/neck pain  Any new symptoms which are concerning to you  Please note:  Although the local anesthetic injected can often make your back or neck feel good for several hours after the injection, the pain will likely return.  It takes 3-7 days for steroids to work in the epidural space.  You may not notice any pain relief for at least that one week.  If effective, we will often do a series of three injections spaced 3-6 weeks apart to maximally decrease your pain.  After the initial series, we generally will wait several months before considering a repeat injection of the same type.  If you have any questions, please call [phone] [facility] 

## 2015-11-02 NOTE — Progress Notes (Signed)
Safety precautions to be maintained throughout the outpatient stay will include: orient to surroundings, keep bed in low position, maintain call bell within reach at all times, provide assistance with transfer out of bed and ambulation.  

## 2015-11-02 NOTE — Procedures (Signed)
Date of procedure:  11/02/2015  Preoperative Diagnosis:  1 chronic low back pain 2 lumbar degenerative disc disease Review lumbar radiculopathy  Postoperative Diagnosis:  Same.  Procedure: 1. Caudal epidural steroid injection, 2. Epidural with interpretation. 3. Fluoroscopic guidance.  Surgeon: Lance Bosch, MD  Anesthesia: MAC anesthesia by the nurse and staff under my direction  Informed consent was obtained and the patient appeared to accept and understand the benefits and risks of this procedure.   Pre procedure comments:  None  Description of the Procedure:  The patient was taken to the operating room and placed in the prone position.  Intravenous sedation and MAC anesthesia was administered by the nurse and staff under my direction. After appropriate sedation, the sacrococcygeal area was prepped with Betadine.  After adequate draping, the area between the sacral cornu was palpated and infiltrated with 3 cc of 1% Lidocaine.   An AP fluoroscopic view of the sacrum was visualized and a 17 gauge Tuohy needle was inserted in the midline at the angle of 45 degrees through the sacrococcygeal membrane.  After making contact with the bone, the needle was withdrawn and readvanced in horizontal position, into the caudal epidural space.  Epidurogram Study:  One cc of Omnipaque 300 was injected through the needle and epidurogram was visualized in both the later and AP views.  Comments:   This procedure was done using fluoroscopic guidance Fluoroscopic time was 0.1 minutes Number of fluoroscopic frames were 2 MG Y was 2.4 No catheter was used.  Caudal Epidural Steroid Injection:  Then 10 cc of 0.25% Bupivacaine and 80 mg of Kenalog were injected into the Caudal epidural space.  The needle was removed and adequate hemostasis was established.    The patient tolerated the procedure quite well and vital signs were stable.  There were no adverse effects.  Additional comments:     The patient was taken to the recovery room in satisfactory condition where the patient was observed and subsequently discharged home.  Will follow up in the clinic in the 3 weeks.  Lance Bosch M.D.

## 2015-11-07 ENCOUNTER — Telehealth: Payer: Self-pay

## 2015-11-07 NOTE — Telephone Encounter (Signed)
Pt denies any needs at this time- states he is doing things helping family and hisself- Instructed pt not to overdo it and need to rest and not lift anything heavy

## 2015-11-16 ENCOUNTER — Ambulatory Visit: Payer: Self-pay | Admitting: Anesthesiology

## 2015-12-18 ENCOUNTER — Other Ambulatory Visit: Payer: Self-pay | Admitting: Psychiatry

## 2016-01-02 ENCOUNTER — Emergency Department
Admission: EM | Admit: 2016-01-02 | Discharge: 2016-01-02 | Disposition: A | Discharge status: Home / Self Care | Payer: Medicare Other | Attending: Emergency Medicine | Admitting: Emergency Medicine

## 2016-01-02 ENCOUNTER — Encounter: Payer: Self-pay | Admitting: Emergency Medicine

## 2016-01-02 DIAGNOSIS — I10 Essential (primary) hypertension: Secondary | ICD-10-CM | POA: Diagnosis not present

## 2016-01-02 DIAGNOSIS — H578 Other specified disorders of eye and adnexa: Secondary | ICD-10-CM | POA: Diagnosis present

## 2016-01-02 DIAGNOSIS — H109 Unspecified conjunctivitis: Secondary | ICD-10-CM | POA: Diagnosis not present

## 2016-01-02 DIAGNOSIS — F17210 Nicotine dependence, cigarettes, uncomplicated: Secondary | ICD-10-CM | POA: Insufficient documentation

## 2016-01-02 MED ORDER — FLUORESCEIN SODIUM 1 MG OP STRP
1.0000 | ORAL_STRIP | Freq: Once | OPHTHALMIC | Status: AC
  Administered 2016-01-02: 1 via OPHTHALMIC
  Filled 2016-01-02: qty 1

## 2016-01-02 MED ORDER — NEOMYCIN-POLYMYXIN-HC 3.5-10000-1 OP SUSP: 3.0000 [drp] | Freq: Four times a day (QID) | OPHTHALMIC | 0 refills | Status: DC

## 2016-01-02 MED ORDER — TETRACAINE HCL 0.5 % OP SOLN
2.0000 [drp] | Freq: Once | OPHTHALMIC | Status: AC
Start: 2016-01-02 — End: 2016-01-02
  Administered 2016-01-02: 2 [drp] via OPHTHALMIC
  Filled 2016-01-02: qty 2

## 2016-01-02 NOTE — ED Triage Notes (Signed)
States he may have gotten something in left eye about 1 week ago   Left eye red and irritated

## 2016-01-02 NOTE — ED Provider Notes (Signed)
[facility] Emergency Department Provider Note  ____________________________________________  Time seen: Approximately 11:09 AM  I have reviewed the triage vital signs and the nursing notes.   HISTORY  Chief Complaint Eye Pain    HPI Michael Patterson is a 55 y.o. male , NAD, presents to emergency with 1 week history of left eye redness, irritation and drainage. States he was visiting a friend one week ago and was talking with him outside. The friend was weed eating his lawn and the patient was standing in front of him. He believes that some grass or debris went into his left eye. Since that time he has had irritation, redness, swelling and drainage about the left eye that is not improving. Has completed warm compresses and rinsed with saline but the symptoms persist. States he has woken up the last 2-3 mornings with the left I'm at a as well as swollen. Swelling tends to improve through the day but again will not resolve. Does note some trace blurry vision due to discharge. Denies any pain about the eye or loss of vision. No blunt trauma or injury to the eye or the face. Has not had any headaches, chest pain, shortness of breath. Denies any neck pain, nasal congestion, runny nose.   Past Medical History:  Diagnosis Date  . Alcohol abuse   . Arthritis   . GERD (gastroesophageal reflux disease)   . Hypertension     Patient Active Problem List   Diagnosis Date Noted  . TBI (traumatic brain injury) ([place]) 10/11/2015  . Major depressive disorder, recurrent severe without psychotic features ([place]) 10/10/2015  . Cannabis use disorder, moderate, dependence ([place]) 10/10/2015  . Alcohol use disorder, severe, dependence ([place]) 10/10/2015  . Homicidal ideation 10/10/2015  . Involuntary commitment 10/10/2015  . Tobacco use disorder 10/10/2015  . Back pain at L4-L5 level 05/16/2015  . DDD (degenerative disc disease), lumbosacral 05/16/2015  . Lumbar radiculopathy 05/16/2015   . Arthropathy of left hip 05/16/2015    Past Surgical History:  Procedure Laterality Date  . HIP FRACTURE SURGERY    . SKIN GRAFT Left hand    Prior to Admission medications   Medication Sig Start Date End Date Taking? Authorizing Provider  acetaminophen (TYLENOL) 325 MG tablet Take 325-650 mg by mouth every 6 (six) hours as needed for moderate pain. Reported on 10/05/2015 07/04/09   Historical Provider, MD  docusate sodium (STOOL SOFTENER) 100 MG capsule Take 100 mg by mouth daily. Reported on 10/05/2015 07/04/09   Historical Provider, MD  hydrochlorothiazide (HYDRODIURIL) 25 MG tablet Take 1 tablet (25 mg total) by mouth 30 (thirty) minutes before procedure. Reported on 10/05/2015 10/14/15   Clovis Fredrickson, MD  hydrOXYzine (ATARAX/VISTARIL) 25 MG tablet Take 1 tablet (25 mg total) by mouth 3 (three) times daily as needed for anxiety. 10/14/15   Clovis Fredrickson, MD  losartan-hydrochlorothiazide (HYZAAR) 50-12.5 MG tablet Take 1 tablet by mouth daily. 10/14/15   Clovis Fredrickson, MD  naproxen (NAPROSYN) 500 MG tablet Take 500 mg by mouth daily. Reported on 10/05/2015    Historical Provider, MD  naproxen (NAPROSYN) 500 MG tablet Take 1 tablet (500 mg total) by mouth 2 (two) times daily with a meal. Reported on 10/05/2015 10/14/15   Clovis Fredrickson, MD  neomycin-polymyxin-hydrocortisone (CORTISPORIN) 3.5-10000-1 ophthalmic suspension Place 3 drops into the left eye 4 (four) times daily. Use drops for 7 days 01/02/16   Jami L Hagler, PA-C  oxyCODONE (OXY IR/ROXICODONE) 5 MG immediate release tablet  Take 5 mg by mouth. Reported on 11/02/2015 01/24/15   Historical Provider, MD  ranitidine (ZANTAC) 150 MG capsule Take 150 mg by mouth daily. Reported on 10/05/2015    Historical Provider, MD  sertraline (ZOLOFT) 100 MG tablet Take 100 mg by mouth daily. Reported on 10/05/2015    Historical Provider, MD  sertraline (ZOLOFT) 100 MG tablet Take 1 tablet (100 mg total) by mouth daily. 10/14/15   Clovis Fredrickson, MD  traZODone (DESYREL) 100 MG tablet Take 1 tablet (100 mg total) by mouth at bedtime. 10/14/15   Clovis Fredrickson, MD  Vitamin D, Ergocalciferol, (DRISDOL) 50000 units CAPS capsule Take 50,000 Units by mouth every 7 (seven) days. Reported on 10/05/2015    Historical Provider, MD    Allergies Aspirin  Family History  Problem Relation Age of Onset  . Diabetes Mother   . Heart disease Mother   . Hypertension Mother     Social History Social History  Substance Use Topics  . Smoking status: Current Every Day Smoker    Packs/day: 1.00    Types: Cigarettes  . Smokeless tobacco: Never Used  . Alcohol use 0.6 oz/week    1 Cans of beer per week     Review of Systems  Constitutional: No fever/chills Eyes:Positive blurred vision, drainage, swelling left eye. No pain. ENT: No sore throat, nasal congestion, runny nose, sneezing. Cardiovascular: No chest pain. Respiratory: No shortness of breath.  Musculoskeletal: Negative for neck pain.  Skin: Negative for rash, redness, skin sores. Neurological: Negative for headaches. 10-point ROS otherwise negative.  ____________________________________________   PHYSICAL EXAM:  VITAL SIGNS: ED Triage Vitals  Enc Vitals Group     BP 01/02/16 1013 (!) 161/96     Pulse Rate 01/02/16 1013 (!) 58     Resp 01/02/16 1013 18     Temp 01/02/16 1013 97.8 F (36.6 C)     Temp Source 01/02/16 1013 Oral     SpO2 01/02/16 1013 100 %     Weight 01/02/16 1005 165 lb (74.8 kg)     Height 01/02/16 1005 6\' 1"  (1.854 m)     Head Circumference --      Peak Flow --      Pain Score 01/02/16 1004 7     Pain Loc --      Pain Edu? --      Excl. in [place]? --      Constitutional: Alert and oriented. Well appearing and in no acute distress. Eyes: Left conjunctiva with mild erythema and injection with clear, mucoid discharge. Trace yellow crusting noted about the eyelashes. Upper and lower left eyelids with trace erythema and trace edema and  swelling. No fluorescein up take about the left eye. Right conjunctiva and eyelids without abnormality. No tenderness to compression of bilateral globes. Arcus senilis present. PERRLA. EOMI without pain.  Head: Atraumatic. Neck: Supple with full range of motion. Hematological/Lymphatic/Immunilogical: No cervical lymphadenopathy. Cardiovascular: Normal rate, regular rhythm. Normal S1 and S2.  Good peripheral circulation. Respiratory: Normal respiratory effort without tachypnea or retractions. Lungs CTAB with breath sounds noted in all lung fields Neurologic:  Normal speech and language. No gross focal neurologic deficits are appreciated. Gait and posture are normal Skin:  Skin is warm, dry and intact. No rash noted. Psychiatric: Mood and affect are normal. Speech and behavior are normal. Patient exhibits appropriate insight and judgement.   ____________________________________________   LABS  None ____________________________________________  EKG  None ____________________________________________  RADIOLOGY  None ____________________________________________  PROCEDURES  Procedure(s) performed: None   Procedures   Medications  fluorescein ophthalmic strip 1 strip (1 strip Left Eye Given by Other 01/02/16 1100)  tetracaine (PONTOCAINE) 0.5 % ophthalmic solution 2 drop (2 drops Left Eye Given by Other 01/02/16 1058)     ____________________________________________   INITIAL IMPRESSION / ASSESSMENT AND PLAN / ED COURSE  Pertinent labs & imaging results that were available during my care of the patient were reviewed by me and considered in my medical decision making (see chart for details).  Clinical Course    Patient's diagnosis is consistent with Conjunctivitis of the left eye. Patient will be discharged home with prescriptions for Cortisporin ophthalmic drops to use as directed. Patient is to follow up with Dr. George Ina in ophthalmology if symptoms persist past  this treatment course. Patient is given ED precautions to return to the ED for any worsening or new symptoms.    ____________________________________________  FINAL CLINICAL IMPRESSION(S) / ED DIAGNOSES  Final diagnoses:  Conjunctivitis, left eye      NEW MEDICATIONS STARTED DURING THIS VISIT:  New Prescriptions   NEOMYCIN-POLYMYXIN-HYDROCORTISONE (CORTISPORIN) 3.5-10000-1 OPHTHALMIC SUSPENSION    Place 3 drops into the left eye 4 (four) times daily. Use drops for 7 days         Braxton Feathers, PA-C 01/02/16 1124    Rudene Re, MD 01/03/16 [phone]

## 2016-06-04 DIAGNOSIS — R4589 Other symptoms and signs involving emotional state: Secondary | ICD-10-CM | POA: Insufficient documentation

## 2016-06-04 DIAGNOSIS — G8929 Other chronic pain: Secondary | ICD-10-CM | POA: Insufficient documentation

## 2016-06-04 DIAGNOSIS — I10 Essential (primary) hypertension: Secondary | ICD-10-CM | POA: Insufficient documentation

## 2016-06-04 DIAGNOSIS — F1290 Cannabis use, unspecified, uncomplicated: Secondary | ICD-10-CM | POA: Insufficient documentation

## 2016-06-04 DIAGNOSIS — Z87828 Personal history of other (healed) physical injury and trauma: Secondary | ICD-10-CM | POA: Insufficient documentation

## 2016-06-18 DIAGNOSIS — L219 Seborrheic dermatitis, unspecified: Secondary | ICD-10-CM | POA: Diagnosis not present

## 2016-08-15 DIAGNOSIS — F333 Major depressive disorder, recurrent, severe with psychotic symptoms: Secondary | ICD-10-CM | POA: Diagnosis not present

## 2016-09-17 DIAGNOSIS — L219 Seborrheic dermatitis, unspecified: Secondary | ICD-10-CM | POA: Diagnosis not present

## 2016-10-06 DIAGNOSIS — R768 Other specified abnormal immunological findings in serum: Secondary | ICD-10-CM | POA: Diagnosis not present

## 2017-03-31 DIAGNOSIS — F331 Major depressive disorder, recurrent, moderate: Secondary | ICD-10-CM | POA: Diagnosis not present

## 2017-04-03 DIAGNOSIS — Z125 Encounter for screening for malignant neoplasm of prostate: Secondary | ICD-10-CM | POA: Diagnosis not present

## 2017-04-03 DIAGNOSIS — I10 Essential (primary) hypertension: Secondary | ICD-10-CM | POA: Diagnosis not present

## 2017-04-10 DIAGNOSIS — F17200 Nicotine dependence, unspecified, uncomplicated: Secondary | ICD-10-CM | POA: Diagnosis not present

## 2017-04-10 DIAGNOSIS — F329 Major depressive disorder, single episode, unspecified: Secondary | ICD-10-CM | POA: Diagnosis not present

## 2017-04-10 DIAGNOSIS — I10 Essential (primary) hypertension: Secondary | ICD-10-CM | POA: Diagnosis not present

## 2017-04-10 DIAGNOSIS — R945 Abnormal results of liver function studies: Secondary | ICD-10-CM | POA: Diagnosis not present

## 2018-02-04 ENCOUNTER — Emergency Department: Payer: Medicare Other

## 2018-02-04 ENCOUNTER — Encounter: Payer: Self-pay | Admitting: Emergency Medicine

## 2018-02-04 ENCOUNTER — Emergency Department
Admission: EM | Admit: 2018-02-04 | Discharge: 2018-02-04 | Disposition: A | Discharge status: Home / Self Care | Payer: Medicare Other | Attending: Emergency Medicine | Admitting: Emergency Medicine

## 2018-02-04 DIAGNOSIS — Z5321 Procedure and treatment not carried out due to patient leaving prior to being seen by health care provider: Secondary | ICD-10-CM | POA: Diagnosis not present

## 2018-02-04 DIAGNOSIS — Y939 Activity, unspecified: Secondary | ICD-10-CM | POA: Diagnosis not present

## 2018-02-04 DIAGNOSIS — W228XXA Striking against or struck by other objects, initial encounter: Secondary | ICD-10-CM | POA: Insufficient documentation

## 2018-02-04 DIAGNOSIS — S0990XA Unspecified injury of head, initial encounter: Secondary | ICD-10-CM | POA: Diagnosis not present

## 2018-02-04 DIAGNOSIS — Y929 Unspecified place or not applicable: Secondary | ICD-10-CM | POA: Insufficient documentation

## 2018-02-04 DIAGNOSIS — Y999 Unspecified external cause status: Secondary | ICD-10-CM | POA: Insufficient documentation

## 2018-02-04 NOTE — ED Notes (Signed)
Called for room x 1.  

## 2018-02-04 NOTE — ED Notes (Signed)
Called for room x 2.  
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
## 2018-02-04 NOTE — ED Notes (Signed)
Called for room x3 °

## 2018-07-27 DIAGNOSIS — R52 Pain, unspecified: Secondary | ICD-10-CM | POA: Diagnosis not present

## 2018-07-27 DIAGNOSIS — F1290 Cannabis use, unspecified, uncomplicated: Secondary | ICD-10-CM | POA: Diagnosis not present

## 2018-07-27 DIAGNOSIS — Z125 Encounter for screening for malignant neoplasm of prostate: Secondary | ICD-10-CM | POA: Diagnosis not present

## 2018-07-27 DIAGNOSIS — R2681 Unsteadiness on feet: Secondary | ICD-10-CM | POA: Diagnosis not present

## 2018-07-27 DIAGNOSIS — I10 Essential (primary) hypertension: Secondary | ICD-10-CM | POA: Diagnosis not present

## 2018-07-27 DIAGNOSIS — N50811 Right testicular pain: Secondary | ICD-10-CM | POA: Diagnosis not present

## 2018-07-27 DIAGNOSIS — G8929 Other chronic pain: Secondary | ICD-10-CM | POA: Diagnosis not present

## 2018-07-27 DIAGNOSIS — Z0000 Encounter for general adult medical examination without abnormal findings: Secondary | ICD-10-CM | POA: Diagnosis not present

## 2018-07-27 DIAGNOSIS — F17200 Nicotine dependence, unspecified, uncomplicated: Secondary | ICD-10-CM | POA: Diagnosis not present

## 2018-07-27 DIAGNOSIS — S0990XD Unspecified injury of head, subsequent encounter: Secondary | ICD-10-CM | POA: Diagnosis not present

## 2018-07-27 DIAGNOSIS — Z87828 Personal history of other (healed) physical injury and trauma: Secondary | ICD-10-CM | POA: Diagnosis not present

## 2019-02-08 DIAGNOSIS — F17200 Nicotine dependence, unspecified, uncomplicated: Secondary | ICD-10-CM | POA: Diagnosis not present

## 2019-02-08 DIAGNOSIS — I10 Essential (primary) hypertension: Secondary | ICD-10-CM | POA: Diagnosis not present

## 2019-02-08 DIAGNOSIS — Z0000 Encounter for general adult medical examination without abnormal findings: Secondary | ICD-10-CM | POA: Diagnosis not present

## 2019-02-08 DIAGNOSIS — N50811 Right testicular pain: Secondary | ICD-10-CM | POA: Diagnosis not present

## 2019-02-08 DIAGNOSIS — Z125 Encounter for screening for malignant neoplasm of prostate: Secondary | ICD-10-CM | POA: Diagnosis not present

## 2019-02-21 DIAGNOSIS — F1290 Cannabis use, unspecified, uncomplicated: Secondary | ICD-10-CM | POA: Diagnosis not present

## 2019-02-21 DIAGNOSIS — E559 Vitamin D deficiency, unspecified: Secondary | ICD-10-CM | POA: Diagnosis not present

## 2019-02-21 DIAGNOSIS — G8929 Other chronic pain: Secondary | ICD-10-CM | POA: Diagnosis not present

## 2019-02-21 DIAGNOSIS — R519 Headache, unspecified: Secondary | ICD-10-CM | POA: Diagnosis not present

## 2019-02-21 DIAGNOSIS — N529 Male erectile dysfunction, unspecified: Secondary | ICD-10-CM | POA: Diagnosis not present

## 2019-02-21 DIAGNOSIS — G4700 Insomnia, unspecified: Secondary | ICD-10-CM | POA: Diagnosis not present

## 2019-02-21 DIAGNOSIS — I10 Essential (primary) hypertension: Secondary | ICD-10-CM | POA: Diagnosis not present

## 2019-02-21 DIAGNOSIS — M5442 Lumbago with sciatica, left side: Secondary | ICD-10-CM | POA: Diagnosis not present

## 2019-02-21 DIAGNOSIS — K219 Gastro-esophageal reflux disease without esophagitis: Secondary | ICD-10-CM | POA: Diagnosis not present

## 2019-05-18 DIAGNOSIS — Z7289 Other problems related to lifestyle: Secondary | ICD-10-CM | POA: Diagnosis not present

## 2019-05-18 DIAGNOSIS — F17210 Nicotine dependence, cigarettes, uncomplicated: Secondary | ICD-10-CM | POA: Diagnosis not present

## 2019-05-18 DIAGNOSIS — K1370 Unspecified lesions of oral mucosa: Secondary | ICD-10-CM | POA: Diagnosis not present

## 2019-07-08 DIAGNOSIS — F17210 Nicotine dependence, cigarettes, uncomplicated: Secondary | ICD-10-CM | POA: Diagnosis not present

## 2019-07-08 DIAGNOSIS — M25522 Pain in left elbow: Secondary | ICD-10-CM | POA: Diagnosis not present

## 2019-07-08 DIAGNOSIS — J309 Allergic rhinitis, unspecified: Secondary | ICD-10-CM | POA: Diagnosis not present

## 2019-07-08 DIAGNOSIS — F329 Major depressive disorder, single episode, unspecified: Secondary | ICD-10-CM | POA: Diagnosis not present

## 2019-07-08 DIAGNOSIS — I10 Essential (primary) hypertension: Secondary | ICD-10-CM | POA: Diagnosis not present

## 2019-08-19 DIAGNOSIS — Z79899 Other long term (current) drug therapy: Secondary | ICD-10-CM | POA: Diagnosis not present

## 2019-08-19 DIAGNOSIS — F4321 Adjustment disorder with depressed mood: Secondary | ICD-10-CM | POA: Diagnosis not present

## 2019-08-19 DIAGNOSIS — G8929 Other chronic pain: Secondary | ICD-10-CM | POA: Diagnosis not present

## 2019-08-19 DIAGNOSIS — Z87828 Personal history of other (healed) physical injury and trauma: Secondary | ICD-10-CM | POA: Diagnosis not present

## 2019-08-19 DIAGNOSIS — F329 Major depressive disorder, single episode, unspecified: Secondary | ICD-10-CM | POA: Diagnosis not present

## 2019-08-19 DIAGNOSIS — M5442 Lumbago with sciatica, left side: Secondary | ICD-10-CM | POA: Diagnosis not present

## 2019-08-19 DIAGNOSIS — M25522 Pain in left elbow: Secondary | ICD-10-CM | POA: Diagnosis not present

## 2019-08-19 DIAGNOSIS — M25552 Pain in left hip: Secondary | ICD-10-CM | POA: Diagnosis not present

## 2019-08-19 DIAGNOSIS — Z87891 Personal history of nicotine dependence: Secondary | ICD-10-CM | POA: Diagnosis not present

## 2019-08-19 DIAGNOSIS — I10 Essential (primary) hypertension: Secondary | ICD-10-CM | POA: Diagnosis not present

## 2019-08-19 DIAGNOSIS — Z720 Tobacco use: Secondary | ICD-10-CM | POA: Diagnosis not present

## 2019-08-19 DIAGNOSIS — Z0000 Encounter for general adult medical examination without abnormal findings: Secondary | ICD-10-CM | POA: Diagnosis not present

## 2019-11-14 DIAGNOSIS — F329 Major depressive disorder, single episode, unspecified: Secondary | ICD-10-CM | POA: Diagnosis not present

## 2019-11-14 DIAGNOSIS — E559 Vitamin D deficiency, unspecified: Secondary | ICD-10-CM | POA: Diagnosis not present

## 2019-11-14 DIAGNOSIS — I10 Essential (primary) hypertension: Secondary | ICD-10-CM | POA: Diagnosis not present

## 2019-11-14 DIAGNOSIS — M545 Low back pain: Secondary | ICD-10-CM | POA: Diagnosis not present

## 2019-11-14 DIAGNOSIS — F419 Anxiety disorder, unspecified: Secondary | ICD-10-CM | POA: Diagnosis not present

## 2019-11-14 DIAGNOSIS — M25522 Pain in left elbow: Secondary | ICD-10-CM | POA: Diagnosis not present

## 2019-11-14 DIAGNOSIS — F17210 Nicotine dependence, cigarettes, uncomplicated: Secondary | ICD-10-CM | POA: Diagnosis not present

## 2019-11-14 DIAGNOSIS — K219 Gastro-esophageal reflux disease without esophagitis: Secondary | ICD-10-CM | POA: Diagnosis not present

## 2019-11-14 DIAGNOSIS — Z87828 Personal history of other (healed) physical injury and trauma: Secondary | ICD-10-CM | POA: Diagnosis not present

## 2019-12-09 ENCOUNTER — Encounter: Payer: Self-pay | Admitting: Emergency Medicine

## 2019-12-09 ENCOUNTER — Other Ambulatory Visit: Payer: Self-pay

## 2019-12-09 ENCOUNTER — Emergency Department
Admission: EM | Admit: 2019-12-09 | Discharge: 2019-12-09 | Disposition: A | Discharge status: Home / Self Care | Payer: Medicare HMO | Attending: Emergency Medicine | Admitting: Emergency Medicine

## 2019-12-09 DIAGNOSIS — R202 Paresthesia of skin: Secondary | ICD-10-CM | POA: Insufficient documentation

## 2019-12-09 DIAGNOSIS — Z5321 Procedure and treatment not carried out due to patient leaving prior to being seen by health care provider: Secondary | ICD-10-CM | POA: Insufficient documentation

## 2019-12-09 DIAGNOSIS — N4889 Other specified disorders of penis: Secondary | ICD-10-CM | POA: Insufficient documentation

## 2019-12-09 LAB — URINALYSIS, COMPLETE (UACMP) WITH MICROSCOPIC
Bacteria, UA: NONE SEEN
Bilirubin Urine: NEGATIVE
Glucose, UA: NEGATIVE mg/dL
Ketones, ur: NEGATIVE mg/dL
Leukocytes,Ua: NEGATIVE
Nitrite: NEGATIVE
Protein, ur: NEGATIVE mg/dL
Specific Gravity, Urine: 1.006 (ref 1.005–1.030)
Squamous Epithelial / LPF: NONE SEEN (ref 0–5)
pH: 5 (ref 5.0–8.0)

## 2019-12-09 LAB — CBC
HCT: 42.3 % (ref 39.0–52.0)
Hemoglobin: 14.8 g/dL (ref 13.0–17.0)
MCH: 33.9 pg (ref 26.0–34.0)
MCHC: 35 g/dL (ref 30.0–36.0)
MCV: 96.8 fL (ref 80.0–100.0)
Platelets: 342 10*3/uL (ref 150–400)
RBC: 4.37 MIL/uL (ref 4.22–5.81)
RDW: 14 % (ref 11.5–15.5)
WBC: 9.4 10*3/uL (ref 4.0–10.5)
nRBC: 0 % (ref 0.0–0.2)

## 2019-12-09 LAB — COMPREHENSIVE METABOLIC PANEL
ALT: 38 U/L (ref 0–44)
AST: 71 U/L — ABNORMAL HIGH (ref 15–41)
Albumin: 4.3 g/dL (ref 3.5–5.0)
Alkaline Phosphatase: 54 U/L (ref 38–126)
Anion gap: 10 (ref 5–15)
BUN: 7 mg/dL (ref 6–20)
CO2: 25 mmol/L (ref 22–32)
Calcium: 8.8 mg/dL — ABNORMAL LOW (ref 8.9–10.3)
Chloride: 101 mmol/L (ref 98–111)
Creatinine, Ser: 0.92 mg/dL (ref 0.61–1.24)
GFR calc Af Amer: >60 mL/min (ref >60)
GFR calc non Af Amer: >60 mL/min (ref >60)
Glucose, Bld: 78 mg/dL (ref 70–99)
Potassium: 4.3 mmol/L (ref 3.5–5.1)
Sodium: 136 mmol/L (ref 135–145)
Total Bilirubin: 0.5 mg/dL (ref 0.3–1.2)
Total Protein: 7.5 g/dL (ref 6.5–8.1)

## 2019-12-09 LAB — CHLAMYDIA/NGC RT PCR (ARMC ONLY)
Chlamydia Tr: NOT DETECTED
N gonorrhoeae: NOT DETECTED

## 2019-12-09 NOTE — ED Triage Notes (Signed)
"  I want to be checked for everything, covid, STD, everything".  Asked patient what symptoms he was here for and c/o tingling in both hands and feet. Ambulatory to triage, VSS.  Also has and intermittent pain in penis, denies urinary sx and penile discharge.  Denies being exposed to STD that he knows of.  NAD. No fevers.

## 2020-01-12 DIAGNOSIS — R202 Paresthesia of skin: Secondary | ICD-10-CM | POA: Diagnosis not present

## 2020-02-06 ENCOUNTER — Emergency Department
Admission: EM | Admit: 2020-02-06 | Discharge: 2020-02-06 | Discharge status: Against Medical Advice | Payer: Medicare HMO

## 2020-02-06 DIAGNOSIS — R404 Transient alteration of awareness: Secondary | ICD-10-CM | POA: Diagnosis not present

## 2020-02-06 DIAGNOSIS — R52 Pain, unspecified: Secondary | ICD-10-CM | POA: Diagnosis not present

## 2020-02-06 DIAGNOSIS — I10 Essential (primary) hypertension: Secondary | ICD-10-CM | POA: Diagnosis not present

## 2020-02-06 DIAGNOSIS — R000 Tachycardia, unspecified: Secondary | ICD-10-CM | POA: Diagnosis not present

## 2020-02-06 DIAGNOSIS — R442 Other hallucinations: Secondary | ICD-10-CM | POA: Diagnosis not present

## 2020-02-06 NOTE — ED Triage Notes (Signed)
First Nurse Note:  Arrives via [facility].  C/O hallucinating x 1 day after using crack cocaine.  Per EMS report, VS wnl.  NAD

## 2020-05-30 DIAGNOSIS — Z87828 Personal history of other (healed) physical injury and trauma: Secondary | ICD-10-CM | POA: Diagnosis not present

## 2020-05-30 DIAGNOSIS — F3340 Major depressive disorder, recurrent, in remission, unspecified: Secondary | ICD-10-CM | POA: Insufficient documentation

## 2020-05-30 DIAGNOSIS — I10 Essential (primary) hypertension: Secondary | ICD-10-CM | POA: Diagnosis not present

## 2020-05-30 DIAGNOSIS — F32A Depression, unspecified: Secondary | ICD-10-CM | POA: Diagnosis not present

## 2020-05-30 DIAGNOSIS — Z79899 Other long term (current) drug therapy: Secondary | ICD-10-CM | POA: Diagnosis not present

## 2020-09-28 ENCOUNTER — Other Ambulatory Visit: Payer: Self-pay

## 2020-09-28 ENCOUNTER — Emergency Department
Admission: EM | Admit: 2020-09-28 | Discharge: 2020-09-28 | Disposition: A | Discharge status: Home / Self Care | Payer: Medicare HMO | Attending: Emergency Medicine | Admitting: Emergency Medicine

## 2020-09-28 DIAGNOSIS — N50819 Testicular pain, unspecified: Secondary | ICD-10-CM | POA: Diagnosis not present

## 2020-09-28 DIAGNOSIS — N4889 Other specified disorders of penis: Secondary | ICD-10-CM | POA: Insufficient documentation

## 2020-09-28 DIAGNOSIS — Z5321 Procedure and treatment not carried out due to patient leaving prior to being seen by health care provider: Secondary | ICD-10-CM | POA: Insufficient documentation

## 2020-09-28 NOTE — ED Triage Notes (Addendum)
Pt in from home d/t "testical and penis pain" as well as "a bite" on his back per pt. Denies fever. Pt reports urination is "slow" as of lately; denies burning upon urination. No spot noted on pt's upper back other that where it is red from where he has "been scratching." Pt denies redness or inflammation to penis/testicles.

## 2020-10-02 ENCOUNTER — Other Ambulatory Visit: Payer: Self-pay

## 2020-10-02 ENCOUNTER — Emergency Department: Payer: Medicare HMO

## 2020-10-02 ENCOUNTER — Emergency Department
Admission: EM | Admit: 2020-10-02 | Discharge: 2020-10-02 | Disposition: A | Discharge status: Home / Self Care | Payer: Medicare HMO | Attending: Emergency Medicine | Admitting: Emergency Medicine

## 2020-10-02 DIAGNOSIS — R1030 Lower abdominal pain, unspecified: Secondary | ICD-10-CM | POA: Diagnosis not present

## 2020-10-02 DIAGNOSIS — F17210 Nicotine dependence, cigarettes, uncomplicated: Secondary | ICD-10-CM | POA: Diagnosis not present

## 2020-10-02 DIAGNOSIS — K3189 Other diseases of stomach and duodenum: Secondary | ICD-10-CM | POA: Diagnosis not present

## 2020-10-02 DIAGNOSIS — R52 Pain, unspecified: Secondary | ICD-10-CM | POA: Diagnosis not present

## 2020-10-02 DIAGNOSIS — Z79899 Other long term (current) drug therapy: Secondary | ICD-10-CM | POA: Insufficient documentation

## 2020-10-02 DIAGNOSIS — N50812 Left testicular pain: Secondary | ICD-10-CM

## 2020-10-02 DIAGNOSIS — N50811 Right testicular pain: Secondary | ICD-10-CM | POA: Insufficient documentation

## 2020-10-02 DIAGNOSIS — I700 Atherosclerosis of aorta: Secondary | ICD-10-CM | POA: Diagnosis not present

## 2020-10-02 DIAGNOSIS — I10 Essential (primary) hypertension: Secondary | ICD-10-CM | POA: Insufficient documentation

## 2020-10-02 DIAGNOSIS — K219 Gastro-esophageal reflux disease without esophagitis: Secondary | ICD-10-CM | POA: Insufficient documentation

## 2020-10-02 DIAGNOSIS — Q438 Other specified congenital malformations of intestine: Secondary | ICD-10-CM | POA: Diagnosis not present

## 2020-10-02 DIAGNOSIS — I878 Other specified disorders of veins: Secondary | ICD-10-CM | POA: Diagnosis not present

## 2020-10-02 DIAGNOSIS — N451 Epididymitis: Secondary | ICD-10-CM | POA: Diagnosis not present

## 2020-10-02 LAB — URINALYSIS, COMPLETE (UACMP) WITH MICROSCOPIC
Bacteria, UA: NONE SEEN
Bilirubin Urine: NEGATIVE
Glucose, UA: NEGATIVE mg/dL
Ketones, ur: NEGATIVE mg/dL
Leukocytes,Ua: NEGATIVE
Nitrite: NEGATIVE
Protein, ur: NEGATIVE mg/dL
Specific Gravity, Urine: 1.013 (ref 1.005–1.030)
pH: 5 (ref 5.0–8.0)

## 2020-10-02 LAB — CHLAMYDIA/NGC RT PCR (ARMC ONLY)
Chlamydia Tr: NOT DETECTED
N gonorrhoeae: NOT DETECTED

## 2020-10-02 MED ORDER — NAPROXEN 500 MG PO TABS: 500.0000 mg | ORAL_TABLET | Freq: Two times a day (BID) | ORAL | 0 refills | Status: AC

## 2020-10-02 MED ORDER — SULFAMETHOXAZOLE-TRIMETHOPRIM 800-160 MG PO TABS: 1.0000 | ORAL_TABLET | Freq: Two times a day (BID) | ORAL | 0 refills | Status: AC

## 2020-10-02 NOTE — ED Notes (Signed)
See triage note, pt reports groin pain radiating to back intermittent x3 days. Lower abd tender with palpation

## 2020-10-02 NOTE — ED Provider Notes (Signed)
[facility] Emergency Department Provider Note  ____________________________________________  Time seen: Approximately 9:38 AM  I have reviewed the triage vital signs and the nursing notes.   HISTORY  Chief Complaint Groin Pain    HPI Michael Patterson is a 60 y.o. male with a history of hypertension GERD and alcohol abuse who comes the ED complaining of groin/testicular pain radiating to the back for the past 3 days, intermittent, no aggravating or alleviating factors.  Does report some difficulty with urination as well but denies dysuria or penile discharge.  No fevers or chills, no chest pain or shortness of breath.  No vomiting diarrhea or constipation.  No trauma.  No recent sexual activity.      Past Medical History:  Diagnosis Date  . Alcohol abuse   . Arthritis   . GERD (gastroesophageal reflux disease)   . Hypertension      Patient Active Problem List   Diagnosis Date Noted  . TBI (traumatic brain injury) ([place]) 10/11/2015  . Major depressive disorder, recurrent severe without psychotic features ([place]) 10/10/2015  . Cannabis use disorder, moderate, dependence ([place]) 10/10/2015  . Alcohol use disorder, severe, dependence ([place]) 10/10/2015  . Homicidal ideation 10/10/2015  . Involuntary commitment 10/10/2015  . Tobacco use disorder 10/10/2015  . Back pain at L4-L5 level 05/16/2015  . DDD (degenerative disc disease), lumbosacral 05/16/2015  . Lumbar radiculopathy 05/16/2015  . Arthropathy of left hip 05/16/2015     Past Surgical History:  Procedure Laterality Date  . HIP FRACTURE SURGERY    . SKIN GRAFT Left hand     Prior to Admission medications   Medication Sig Start Date End Date Taking? Authorizing Provider  naproxen (NAPROSYN) 500 MG tablet Take 1 tablet (500 mg total) by mouth 2 (two) times daily with a meal. 10/02/20  Yes Carrie Mew, MD  sulfamethoxazole-trimethoprim (BACTRIM DS) 800-160 MG tablet Take 1 tablet by mouth 2  (two) times daily for 10 days. 10/02/20 10/12/20 Yes Carrie Mew, MD  acetaminophen (TYLENOL) 325 MG tablet Take 325-650 mg by mouth every 6 (six) hours as needed for moderate pain. Reported on 10/05/2015 07/04/09   [provider]  docusate sodium (STOOL SOFTENER) 100 MG capsule Take 100 mg by mouth daily. Reported on 10/05/2015 07/04/09   [provider]  hydrochlorothiazide (HYDRODIURIL) 25 MG tablet Take 1 tablet (25 mg total) by mouth 30 (thirty) minutes before procedure. Reported on 10/05/2015 10/14/15   Pucilowska, Wardell Honour, MD  hydrOXYzine (ATARAX/VISTARIL) 25 MG tablet Take 1 tablet (25 mg total) by mouth 3 (three) times daily as needed for anxiety. 10/14/15   Pucilowska, Jolanta B, MD  losartan-hydrochlorothiazide (HYZAAR) 50-12.5 MG tablet Take 1 tablet by mouth daily. 10/14/15   Pucilowska, Jolanta B, MD  neomycin-polymyxin-hydrocortisone (CORTISPORIN) 3.5-10000-1 ophthalmic suspension Place 3 drops into the left eye 4 (four) times daily. Use drops for 7 days 01/02/16   Hagler, Jami L, PA-C  oxyCODONE (OXY IR/ROXICODONE) 5 MG immediate release tablet Take 5 mg by mouth. Reported on 11/02/2015 01/24/15   [provider]  ranitidine (ZANTAC) 150 MG capsule Take 150 mg by mouth daily. Reported on 10/05/2015    [provider]  sertraline (ZOLOFT) 100 MG tablet Take 100 mg by mouth daily. Reported on 10/05/2015    [provider]  sertraline (ZOLOFT) 100 MG tablet Take 1 tablet (100 mg total) by mouth daily. 10/14/15   Pucilowska, Herma Ard B, MD  traZODone (DESYREL) 100 MG tablet Take 1 tablet (100 mg total)  by mouth at bedtime. 10/14/15   Pucilowska, Wardell Honour, MD  Vitamin D, Ergocalciferol, (DRISDOL) 50000 units CAPS capsule Take 50,000 Units by mouth every 7 (seven) days. Reported on 10/05/2015    [provider]     Allergies Aspirin   Family History  Problem Relation Age of Onset  . Diabetes Mother   . Heart disease Mother   . Hypertension Mother      Social History Social History   Tobacco Use  . Smoking status: Current Every Day Smoker    Packs/day: 1.00    Types: Cigarettes  . Smokeless tobacco: Never Used  Substance Use Topics  . Alcohol use: Yes    Alcohol/week: 1.0 standard drink    Types: 1 Cans of beer per week  . Drug use: Yes    Types: Marijuana, Cocaine    Review of Systems  Constitutional:   No fever or chills.  ENT:   No sore throat. No rhinorrhea. Cardiovascular:   No chest pain or syncope. Respiratory:   No dyspnea or cough. Gastrointestinal:   Positive for lower abdominal pain and testicular pain. Musculoskeletal:   Negative for focal pain or swelling All other systems reviewed and are negative except as documented above in ROS and HPI.  ____________________________________________   PHYSICAL EXAM:  VITAL SIGNS: ED Triage Vitals  Enc Vitals Group     BP 10/02/20 0749 (!) 150/83     Pulse Rate 10/02/20 0749 64     Resp 10/02/20 0749 15     Temp 10/02/20 0749 98.4 F (36.9 C)     Temp Source 10/02/20 0749 Oral     SpO2 10/02/20 0749 100 %     Weight 10/02/20 0750 160 lb (72.6 kg)     Height 10/02/20 0750 6\' 1"  (1.854 m)     Head Circumference --      Peak Flow --      Pain Score 10/02/20 0749 9     Pain Loc --      Pain Edu? --      Excl. in [place]? --     Vital signs reviewed, nursing assessments reviewed.   Constitutional:   Alert and oriented. Non-toxic appearance. Eyes:   Conjunctivae are normal. EOMI. PERRL. ENT      Head:   Normocephalic and atraumatic.      Nose:   Wearing a mask.      Mouth/Throat:   Wearing a mask.      Neck:   No meningismus. Full ROM. Hematological/Lymphatic/Immunilogical:   No cervical lymphadenopathy. Cardiovascular:   RRR. Symmetric bilateral radial and DP pulses.  No murmurs. Cap refill less than 2 seconds. Respiratory:   Normal respiratory effort without tachypnea/retractions. Breath sounds are clear and equal bilaterally. No  wheezes/rales/rhonchi. Gastrointestinal:   Soft with right lower quadrant and suprapubic tenderness. Non distended. There is no CVA tenderness.  No rebound, rigidity, or guarding. Genitourinary:   Examined with nurse Breanna at bedside.  Normal genitalia, no lesions.  No scrotal swelling or inflammation.  No testicular mass, no horizontal lie.  Cremasteric reflexes are intact.  There is some posterior epididymal tenderness.  No mass. Musculoskeletal:   Normal range of motion in all extremities. No joint effusions.  No lower extremity tenderness.  No edema. Neurologic:   Normal speech and language.  Motor grossly intact. No acute focal neurologic deficits are appreciated.  Skin:    Skin is warm, dry and intact. No rash noted.  No petechiae, purpura, or bullae.  ____________________________________________    LABS (pertinent positives/negatives) (all labs ordered are listed, but only abnormal results are displayed) Labs Reviewed  URINALYSIS, COMPLETE (UACMP) WITH MICROSCOPIC - Abnormal; Notable for the following components:      Result Value   Color, Urine YELLOW (*)    APPearance CLEAR (*)    Hgb urine dipstick SMALL (*)    All other components within normal limits  CHLAMYDIA/NGC RT PCR ([facility] ONLY)   ____________________________________________   EKG    ____________________________________________    RADIOLOGY  CT Renal Stone Study  Result Date: 10/02/2020 CLINICAL DATA:  60 year old male with right flank pain radiating to the groin intermittently for 3 days. EXAM: CT ABDOMEN AND PELVIS WITHOUT CONTRAST TECHNIQUE: Multidetector CT imaging of the abdomen and pelvis was performed following the standard protocol without IV contrast. COMPARISON:  None. FINDINGS: Lower chest: Minor right lung base atelectasis.  Otherwise negative. Hepatobiliary: Negative noncontrast liver. Layering sludge is evident within the gallbladder on series 2, image 30. No pericholecystic  inflammation. Pancreas: Negative noncontrast pancreas. Spleen: Diminutive, negative. Adrenals/Urinary Tract: Normal adrenal glands. No hydronephrosis or nephrolithiasis. Noncontrast kidneys appear symmetric. Both proximal ureters appear decompressed. And the distal right ureter appears normal at the UVJ on series 2, image 66. There are bilateral pelvic phleboliths. The urinary bladder is decompressed and unremarkable. Stomach/Bowel: Negative large bowel aside from some redundancy and occasional diverticula. Normal appendix on series 2, image 52 and coronal image 69. Negative terminal ileum. No dilated small bowel. Decompressed and negative stomach. No free air, free fluid. Vascular/Lymphatic: Aortoiliac calcified atherosclerosis. Normal caliber abdominal aorta. Vascular patency is not evaluated in the absence of IV contrast. No lymphadenopathy. Reproductive: Negative. Other: No pelvic free fluid. Musculoskeletal: Previous left femur ORIF. Mild lumbar disc bulging. No acute osseous abnormality identified. IMPRESSION: 1. Layering sludge within the gallbladder, but no noncontrast CT evidence of acute cholecystitis. 2. No urinary calculus or obstructive uropathy.  Normal appendix. 3. Aortic Atherosclerosis (ICD10-I70.0). Electronically Signed   By: Genevie Ann M.D.   On: 10/02/2020 08:40    ____________________________________________   PROCEDURES Procedures  ____________________________________________  DIFFERENTIAL DIAGNOSIS   Appendicitis, ureterolithiasis, epididymitis, cystitis  CLINICAL IMPRESSION / ASSESSMENT AND PLAN / ED COURSE  Medications ordered in the ED: Medications - No data to display  Pertinent labs & imaging results that were available during my care of the patient were reviewed by me and considered in my medical decision making (see chart for details).  HILLERY BHALLA was evaluated in Emergency Department on 10/02/2020 for the symptoms described in the history of present illness. He  was evaluated in the context of the global COVID-19 pandemic, which necessitated consideration that the patient might be at risk for infection with the SARS-CoV-2 virus that causes COVID-19. Institutional protocols and algorithms that pertain to the evaluation of patients at risk for COVID-19 are in a state of rapid change based on information released by regulatory bodies including the CDC and federal and state organizations. These policies and algorithms were followed during the patient's care in the ED.     Clinical Course as of 10/02/20 1125  Tue Oct 02, 2020  [id number] Patient complains of testicular pain, has some diffuse lower abdominal tenderness.  Vital signs are normal.  CT scan negative for appendicitis or ureterolithiasis.  Shows some sludge in the gallbladder, but exam is not consistent with cholecystitis or choledocholithiasis. [PS]    Clinical Course User Index [PS] Carrie Mew, MD     ----------------------------------------- 11:25 AM on 10/02/2020 -----------------------------------------  Urinalysis and urine GC chlamydia both normal.  Clinically he has epididymitis but without skin or soft tissue swelling, no evidence of abscess or Fournier's gangrene.  Doubt torsion or hernia.  Stable for discharge with Bactrim and NSAIDs, counseled on scrotal support and follow-up with urology.  ____________________________________________   FINAL CLINICAL IMPRESSION(S) / ED DIAGNOSES    Final diagnoses:  Pain in both testicles     ED Discharge Orders         Ordered    sulfamethoxazole-trimethoprim (BACTRIM DS) 800-160 MG tablet  2 times daily        10/02/20 1125    naproxen (NAPROSYN) 500 MG tablet  2 times daily with meals        10/02/20 1125          Portions of this note were generated with dragon dictation software. Dictation errors may occur despite best attempts at proofreading.   Carrie Mew, MD 10/02/20 1126

## 2020-10-02 NOTE — ED Triage Notes (Signed)
Pt comes into the ED via EMS from home with c/o BL testicle and penile pain with swelling for the past 3 days with intermittent difficulty urinating. Denies any blood or odor to urine.Marland Kitchen

## 2020-10-02 NOTE — ED Triage Notes (Signed)
Arrives via [facility] c/o 10/10 groin pain radiating to mid back.  Presented to ED for same 3 days ago, LWBS.  C/O being difficulty urinating / unable to urinate, per EMS report.  VS wnl.

## 2020-11-16 DIAGNOSIS — F331 Major depressive disorder, recurrent, moderate: Secondary | ICD-10-CM | POA: Diagnosis not present

## 2020-11-16 DIAGNOSIS — Z1389 Encounter for screening for other disorder: Secondary | ICD-10-CM | POA: Diagnosis not present

## 2020-11-16 DIAGNOSIS — F1910 Other psychoactive substance abuse, uncomplicated: Secondary | ICD-10-CM | POA: Diagnosis not present

## 2020-11-16 DIAGNOSIS — M25552 Pain in left hip: Secondary | ICD-10-CM | POA: Diagnosis not present

## 2020-11-16 DIAGNOSIS — G8929 Other chronic pain: Secondary | ICD-10-CM | POA: Diagnosis not present

## 2020-11-16 DIAGNOSIS — R52 Pain, unspecified: Secondary | ICD-10-CM | POA: Diagnosis not present

## 2020-11-16 DIAGNOSIS — F17200 Nicotine dependence, unspecified, uncomplicated: Secondary | ICD-10-CM | POA: Diagnosis not present

## 2020-11-16 DIAGNOSIS — Z125 Encounter for screening for malignant neoplasm of prostate: Secondary | ICD-10-CM | POA: Diagnosis not present

## 2020-11-16 DIAGNOSIS — R7309 Other abnormal glucose: Secondary | ICD-10-CM | POA: Diagnosis not present

## 2020-11-16 DIAGNOSIS — I10 Essential (primary) hypertension: Secondary | ICD-10-CM | POA: Diagnosis not present

## 2020-11-16 DIAGNOSIS — R2681 Unsteadiness on feet: Secondary | ICD-10-CM | POA: Diagnosis not present

## 2020-11-16 DIAGNOSIS — F17210 Nicotine dependence, cigarettes, uncomplicated: Secondary | ICD-10-CM | POA: Diagnosis not present

## 2020-11-16 DIAGNOSIS — L282 Other prurigo: Secondary | ICD-10-CM | POA: Diagnosis not present

## 2020-11-16 DIAGNOSIS — Z0000 Encounter for general adult medical examination without abnormal findings: Secondary | ICD-10-CM | POA: Diagnosis not present

## 2020-11-16 DIAGNOSIS — Z87828 Personal history of other (healed) physical injury and trauma: Secondary | ICD-10-CM | POA: Diagnosis not present

## 2020-11-16 DIAGNOSIS — F32A Depression, unspecified: Secondary | ICD-10-CM | POA: Diagnosis not present

## 2020-11-16 DIAGNOSIS — F1020 Alcohol dependence, uncomplicated: Secondary | ICD-10-CM | POA: Diagnosis not present

## 2020-12-22 DIAGNOSIS — F1420 Cocaine dependence, uncomplicated: Secondary | ICD-10-CM | POA: Diagnosis not present

## 2020-12-22 DIAGNOSIS — F1020 Alcohol dependence, uncomplicated: Secondary | ICD-10-CM | POA: Diagnosis not present

## 2021-05-01 DIAGNOSIS — I10 Essential (primary) hypertension: Secondary | ICD-10-CM | POA: Diagnosis not present

## 2021-05-01 DIAGNOSIS — G8929 Other chronic pain: Secondary | ICD-10-CM | POA: Diagnosis not present

## 2021-05-01 DIAGNOSIS — F17200 Nicotine dependence, unspecified, uncomplicated: Secondary | ICD-10-CM | POA: Diagnosis not present

## 2021-05-01 DIAGNOSIS — Z789 Other specified health status: Secondary | ICD-10-CM | POA: Diagnosis not present

## 2021-05-01 DIAGNOSIS — Z87828 Personal history of other (healed) physical injury and trauma: Secondary | ICD-10-CM | POA: Diagnosis not present

## 2021-05-01 DIAGNOSIS — B351 Tinea unguium: Secondary | ICD-10-CM | POA: Diagnosis not present

## 2021-05-01 DIAGNOSIS — F1290 Cannabis use, unspecified, uncomplicated: Secondary | ICD-10-CM | POA: Diagnosis not present

## 2021-05-01 DIAGNOSIS — F32A Depression, unspecified: Secondary | ICD-10-CM | POA: Diagnosis not present

## 2021-05-01 DIAGNOSIS — M5416 Radiculopathy, lumbar region: Secondary | ICD-10-CM | POA: Diagnosis not present

## 2021-05-15 DIAGNOSIS — S0990XA Unspecified injury of head, initial encounter: Secondary | ICD-10-CM | POA: Diagnosis not present

## 2021-05-15 DIAGNOSIS — H6122 Impacted cerumen, left ear: Secondary | ICD-10-CM | POA: Diagnosis not present

## 2021-09-02 DIAGNOSIS — F17200 Nicotine dependence, unspecified, uncomplicated: Secondary | ICD-10-CM | POA: Diagnosis not present

## 2021-09-02 DIAGNOSIS — F1290 Cannabis use, unspecified, uncomplicated: Secondary | ICD-10-CM | POA: Diagnosis not present

## 2021-09-02 DIAGNOSIS — Z0000 Encounter for general adult medical examination without abnormal findings: Secondary | ICD-10-CM | POA: Diagnosis not present

## 2021-09-02 DIAGNOSIS — I10 Essential (primary) hypertension: Secondary | ICD-10-CM | POA: Diagnosis not present

## 2021-09-02 DIAGNOSIS — Z87828 Personal history of other (healed) physical injury and trauma: Secondary | ICD-10-CM | POA: Diagnosis not present

## 2021-09-02 DIAGNOSIS — Z1211 Encounter for screening for malignant neoplasm of colon: Secondary | ICD-10-CM | POA: Diagnosis not present

## 2021-09-02 DIAGNOSIS — F32A Depression, unspecified: Secondary | ICD-10-CM | POA: Diagnosis not present

## 2021-09-02 DIAGNOSIS — G8929 Other chronic pain: Secondary | ICD-10-CM | POA: Diagnosis not present

## 2021-09-02 DIAGNOSIS — M5416 Radiculopathy, lumbar region: Secondary | ICD-10-CM | POA: Diagnosis not present

## 2021-09-02 DIAGNOSIS — M545 Low back pain, unspecified: Secondary | ICD-10-CM | POA: Diagnosis not present

## 2021-09-02 DIAGNOSIS — R7309 Other abnormal glucose: Secondary | ICD-10-CM | POA: Diagnosis not present

## 2021-09-02 DIAGNOSIS — F3340 Major depressive disorder, recurrent, in remission, unspecified: Secondary | ICD-10-CM | POA: Diagnosis not present

## 2021-09-02 DIAGNOSIS — R194 Change in bowel habit: Secondary | ICD-10-CM | POA: Diagnosis not present

## 2021-11-09 ENCOUNTER — Other Ambulatory Visit: Payer: Self-pay

## 2021-11-09 ENCOUNTER — Emergency Department
Admission: EM | Admit: 2021-11-09 | Discharge: 2021-11-09 | Disposition: A | Discharge status: Home / Self Care | Payer: Medicare HMO | Attending: Emergency Medicine | Admitting: Emergency Medicine

## 2021-11-09 DIAGNOSIS — I10 Essential (primary) hypertension: Secondary | ICD-10-CM | POA: Diagnosis not present

## 2021-11-09 DIAGNOSIS — N4889 Other specified disorders of penis: Secondary | ICD-10-CM | POA: Diagnosis present

## 2021-11-09 DIAGNOSIS — I861 Scrotal varices: Secondary | ICD-10-CM | POA: Diagnosis not present

## 2021-11-09 MED ORDER — HYDROCODONE-ACETAMINOPHEN 5-325 MG PO TABS
1.0000 | ORAL_TABLET | Freq: Once | ORAL | Status: AC
  Administered 2021-11-09: 1 via ORAL
  Filled 2021-11-09: qty 1

## 2021-11-09 MED ORDER — NAPROXEN 500 MG PO TABS
500.0000 mg | ORAL_TABLET | Freq: Once | ORAL | Status: AC
  Administered 2021-11-09: 500 mg via ORAL
  Filled 2021-11-09: qty 1

## 2021-11-09 NOTE — ED Provider Notes (Signed)
[facility] Provider Note    Event Date/Time   First MD Initiated Contact with Patient 11/09/21 1928     (approximate)   History   Chief Complaint: Testicle Pain and Penile Lesion   HPI  Michael Patterson is a 61 y.o. male with a history of hypertension, GERD, alcohol abuse, chronic back pain who complains of seeing a small lesion on his penis today while urinating.  Also complains of chronic testicular pain for the past 6 months.  I saw this patient for testicular pain about a month ago.  At that time his exam clinically was suggestive of epididymitis and he was given a course of Bactrim.  He reports this made no change in his symptoms.  Denies dysuria frequency urgency or hematospermia.  Denies any sexual intercourse.     Physical Exam   Triage Vital Signs: ED Triage Vitals  Enc Vitals Group     BP 11/09/21 1912 (!) 171/96     Pulse Rate 11/09/21 1912 84     Resp 11/09/21 1912 16     Temp 11/09/21 1912 98.3 F (36.8 C)     Temp src --      SpO2 11/09/21 1912 96 %     Weight 11/09/21 1914 160 lb 0.9 oz (72.6 kg)     Height 11/09/21 1914 '6\' 1"'$  (1.854 m)     Head Circumference --      Peak Flow --      Pain Score 11/09/21 1913 9     Pain Loc --      Pain Edu? --      Excl. in [place]? --     Most recent vital signs: Vitals:   11/09/21 1912 11/09/21 2020  BP: (!) 171/96 (!) 153/95  Pulse: 84 (!) 57  Resp: 16 17  Temp: 98.3 F (36.8 C)   SpO2: 96% 100%    General: Awake, no distress.  Ambulatory CV:  Good peripheral perfusion.  Resp:  Normal effort.  Abd:  No distention.  Other:  Genitalia appear normal.  No penile lesions or discharge.  No wounds or tenderness.  Scrotum is symmetric without horizontal lie.  No testicular or epididymis tenderness.  There is knotty fullness in the scrotum suggestive of varicocele.   ED Results / Procedures / Treatments   Labs (all labs ordered are listed, but only abnormal results are displayed) Labs  Reviewed - No data to display   EKG    RADIOLOGY    PROCEDURES:  Procedures   MEDICATIONS ORDERED IN ED: Medications  HYDROcodone-acetaminophen (NORCO/VICODIN) 5-325 MG per tablet 1 tablet (has no administration in time range)  naproxen (NAPROSYN) tablet 500 mg (has no administration in time range)     IMPRESSION / MDM / ASSESSMENT AND PLAN / ED COURSE  I reviewed the triage vital signs and the nursing notes.                              Patient presents with scrotal pain which I think is due to varicocele.  I do not find anything abnormal about the penis, not able to visualize or palpate the lesion that he is concerned about.  No signs of any infection.  He is overall nontoxic, comfortable appearing.  He requests his daily dose of Vicodin here which I will give.  Will defer opioid refills to his PCP.  I encouraged him to follow-up with urology for  further evaluation of his symptoms.  Recommended supportive underwear.       FINAL CLINICAL IMPRESSION(S) / ED DIAGNOSES   Final diagnoses:  Varicocele     Rx / DC Orders   ED Discharge Orders     None        Note:  This document was prepared using Dragon voice recognition software and may include unintentional dictation errors.   Carrie Mew, MD 11/09/21 2033

## 2021-11-09 NOTE — ED Notes (Signed)
Pt states hes been having testicle pain "for a while now" and noticed a rash on his penis today. Pt also endorses chronic back pain.

## 2021-11-20 ENCOUNTER — Other Ambulatory Visit: Payer: Self-pay

## 2021-11-20 DIAGNOSIS — N489 Disorder of penis, unspecified: Secondary | ICD-10-CM

## 2021-11-21 NOTE — Progress Notes (Signed)
11/22/21 10:31 AM   Michael Patterson 1961/02/13 [id number]  Referring provider:  Tracie Harrier, [place] [facility] [place],  [place] [address] Chief Complaint  Patient presents with   New Patient (Initial Visit)    Varicocele, testicle pain, penile lesion      HPI: Michael Patterson is a 61 y.o.male who presents today for further evaluation of varicocele, testicular pain, and penile lesion.   He was seen in the ED on 11/09/2021. He was noted to have testicular pain. He was seen previously for testicular pain that was suggestive of epididymitis and was treated with a course of bactrim. ED visit on 11/09/2021 had ongoing pain.   He had CT stone protocol 1 year ago that was negative.   He reports today that he has pelvic pain and arthritis in his lower back. He reports that he has episodes of anorgasmia. He has the sensation of orgasm when he masturbates and he has pain during masturbation. He intermittently gets an erection  He reports testicular pain that feels like a pinching sensation. He wear boxer briefs. He is not worried about STIs and denies discharge. He wakes up x4 nightly to void he has trouble emptying his bladder. He reports weak stream.  The symptoms are longstanding.  In addition to the above, he does report that he has had a lot of emotional social and physical hardship.  He uses copious amount of alcohol and I suspect he may be intoxicated this morning.  He reports that he drinks 2-4 beers every morning and then drinks throughout the day.  On average, this is a 12 pack, or 340s.  On top of this, he admits to self-medicating with pain medication, and marijuana daily.  IPSS 25, SHIM 10 below. PVR 193 ml today.  PSA 0.63 on 11/2020.   IPSS     Row Name 11/22/21 1000         International Prostate Symptom Score   How often have you had the sensation of not emptying your bladder? Almost always     How often have you had to urinate less  than every two hours? About half the time     How often have you found you stopped and started again several times when you urinated? More than half the time     How often have you found it difficult to postpone urination? More than half the time     How often have you had a weak urinary stream? More than half the time     How often have you had to strain to start urination? Less than 1 in 5 times     How many times did you typically get up at night to urinate? 4 Times     Total IPSS Score 25       Quality of Life due to urinary symptoms   If you were to spend the rest of your life with your urinary condition just the way it is now how would you feel about that? Terrible              Score:  1-7 Mild 8-19 Moderate 20-35 Severe   SHIM     Row Name 11/22/21 1022         SHIM: Over the last 6 months:   How do you rate your confidence that you could get and keep an erection? Very Low     When you had erections with sexual stimulation, how  often were your erections hard enough for penetration (entering your partner)? A Few Times (much less than half the time)     During sexual intercourse, how often were you able to maintain your erection after you had penetrated (entered) your partner? A Few Times (much less than half the time)     During sexual intercourse, how difficult was it to maintain your erection to completion of intercourse? Very Difficult     When you attempted sexual intercourse, how often was it satisfactory for you? Sometimes (about half the time)       SHIM Total Score   SHIM 10                PMH: Past Medical History:  Diagnosis Date   Alcohol abuse    Arthritis    GERD (gastroesophageal reflux disease)    Hypertension     Surgical History: Past Surgical History:  Procedure Laterality Date   HIP FRACTURE SURGERY     SKIN GRAFT Left hand    Home Medications:  Allergies as of 11/22/2021       Reactions   Aspirin    Pt has stomach ulcers and  reports he can not take ASA.         Medication List        Accurate as of November 22, 2021 10:31 AM. If you have any questions, ask your nurse or doctor.          STOP taking these medications    hydrochlorothiazide 25 MG tablet Commonly known as: HYDRODIURIL Stopped by: Hollice Espy, MD   neomycin-polymyxin-hydrocortisone 3.5-10000-1 ophthalmic suspension Commonly known as: CORTISPORIN Stopped by: Hollice Espy, MD   oxyCODONE 5 MG immediate release tablet Commonly known as: Oxy IR/ROXICODONE Stopped by: Hollice Espy, MD   traZODone 100 MG tablet Commonly known as: DESYREL Stopped by: Hollice Espy, MD       TAKE these medications    acetaminophen 325 MG tablet Commonly known as: TYLENOL Take 325-650 mg by mouth every 6 (six) hours as needed for moderate pain. Reported on 10/05/2015   atorvastatin 10 MG tablet Commonly known as: LIPITOR Take by mouth.   HYDROcodone-acetaminophen 5-325 MG tablet Commonly known as: NORCO/VICODIN Take 1 tablet by mouth daily as needed.   hydrOXYzine 25 MG tablet Commonly known as: ATARAX Take 1 tablet (25 mg total) by mouth 3 (three) times daily as needed for anxiety.   losartan-hydrochlorothiazide 50-12.5 MG tablet Commonly known as: HYZAAR Take 1 tablet by mouth daily.   naproxen 500 MG tablet Commonly known as: Naprosyn Take 1 tablet (500 mg total) by mouth 2 (two) times daily with a meal.   ranitidine 150 MG capsule Commonly known as: ZANTAC Take 150 mg by mouth daily. Reported on 10/05/2015   sertraline 100 MG tablet Commonly known as: ZOLOFT Take 100 mg by mouth daily. Reported on 10/05/2015 What changed: Another medication with the same name was removed. Continue taking this medication, and follow the directions you see here. Changed by: Hollice Espy, MD   Stool Softener 100 MG capsule Generic drug: docusate sodium Take 100 mg by mouth daily. Reported on 10/05/2015   Vitamin D (Ergocalciferol) 1.25 MG  (50000 UNIT) Caps capsule Commonly known as: DRISDOL Take 50,000 Units by mouth every 7 (seven) days. Reported on 10/05/2015        Allergies:  Allergies  Allergen Reactions   Aspirin     Pt has stomach ulcers and reports he can not take ASA.  Family History: Family History  Problem Relation Age of Onset   Diabetes Mother    Heart disease Mother    Hypertension Mother     Social History:  reports that he has been smoking cigarettes. He has been smoking an average of 1 pack per day. He has never used smokeless tobacco. He reports current alcohol use of about 1.0 standard drink of alcohol per week. He reports current drug use. Drugs: Marijuana and Cocaine.   Physical Exam: BP (!) 155/83   Pulse 83   Ht '6\' 1"'$  (1.854 m)   Wt 160 lb (72.6 kg)   BMI 21.11 kg/m   Constitutional:  Alert and oriented, No acute distress. HEENT: [place] AT, moist mucus membranes.  Trachea midline, no masses. Cardiovascular: No clubbing, cyanosis, or edema. Respiratory: Normal respiratory effort, no increased work of breathing. GU: No TRUS circumcised phallus with orthotopic meatus, no discharge.  Bilateral descended testicles which are normal in size without masses.  Normal cords bilaterally.  No appreciable palpable varicocele.  Bilateral inguinal exam unremarkable, no obvious hernia. Rectal: Declined Skin: No rashes, bruises or suspicious lesions. Neurologic: Grossly intact, no focal deficits, moving all 4 extremities. Psychiatric: Normal mood and affect.  Laboratory Data:  Lab Results  Component Value Date   CREATININE 0.92 12/09/2019    Pertinent Imaging: Results for orders placed or performed in visit on 11/22/21  BLADDER SCAN AMB NON-IMAGING  Result Value Ref Range   Scan Result 193lb      Assessment & Plan:    BPH with incomplete bladder emptying - PVR 193 mL  - Discussed conservative management including avoiding irritants which includes copious amounts of alcohol which is  certainly contributing factor to his symptoms - Will have him start on flomax for his symptoms - Flomax 0.4 mg; prescribed  -UA today ordered and pending  2. Chronic pelvic pain/ chronic testicular pain - Probable pelvic floor dysfunction may benefit from physical therapy although his ability to undergo this may be limited by his substance issue.    F/u 3 mo IPSS./ PVR  I,Kailey Littlejohn,acting as a scribe for Hollice Espy, MD.,have documented all relevant documentation on the behalf of Hollice Espy, MD,as directed by  Hollice Espy, MD while in the presence of Hollice Espy, MD.   I have reviewed the above documentation for accuracy and completeness, and I agree with the above.   Hollice Espy, MD  [facility] [address], [place], [place] [address] [phone]

## 2021-11-22 ENCOUNTER — Other Ambulatory Visit
Admission: RE | Admit: 2021-11-22 | Discharge: 2021-11-22 | Disposition: A | Discharge status: Home / Self Care | Payer: Medicare HMO | Attending: Urology | Admitting: Urology

## 2021-11-22 ENCOUNTER — Ambulatory Visit (INDEPENDENT_AMBULATORY_CARE_PROVIDER_SITE_OTHER): Payer: Medicare HMO | Admitting: Urology

## 2021-11-22 ENCOUNTER — Encounter: Payer: Self-pay | Admitting: Urology

## 2021-11-22 VITALS — BP 155/83 | HR 83 | Ht 73.0 in | Wt 160.0 lb

## 2021-11-22 DIAGNOSIS — N401 Enlarged prostate with lower urinary tract symptoms: Secondary | ICD-10-CM | POA: Diagnosis not present

## 2021-11-22 DIAGNOSIS — R339 Retention of urine, unspecified: Secondary | ICD-10-CM | POA: Diagnosis not present

## 2021-11-22 DIAGNOSIS — N50819 Testicular pain, unspecified: Secondary | ICD-10-CM

## 2021-11-22 DIAGNOSIS — N489 Disorder of penis, unspecified: Secondary | ICD-10-CM

## 2021-11-22 DIAGNOSIS — G8929 Other chronic pain: Secondary | ICD-10-CM

## 2021-11-22 DIAGNOSIS — R102 Pelvic and perineal pain: Secondary | ICD-10-CM | POA: Diagnosis not present

## 2021-11-22 DIAGNOSIS — R350 Frequency of micturition: Secondary | ICD-10-CM | POA: Diagnosis not present

## 2021-11-22 LAB — URINALYSIS, COMPLETE (UACMP) WITH MICROSCOPIC
Bacteria, UA: NONE SEEN
Bilirubin Urine: NEGATIVE
Glucose, UA: NEGATIVE mg/dL
Hgb urine dipstick: NEGATIVE
Ketones, ur: NEGATIVE mg/dL
Leukocytes,Ua: NEGATIVE
Nitrite: NEGATIVE
Protein, ur: NEGATIVE mg/dL
RBC / HPF: NONE SEEN RBC/hpf (ref 0–5)
Specific Gravity, Urine: <1.005 — ABNORMAL LOW (ref 1.005–1.030)
Squamous Epithelial / HPF: NONE SEEN (ref 0–5)
WBC, UA: NONE SEEN WBC/hpf (ref 0–5)
pH: 6 (ref 5.0–8.0)

## 2021-11-22 LAB — BLADDER SCAN AMB NON-IMAGING

## 2021-11-22 MED ORDER — TAMSULOSIN HCL 0.4 MG PO CAPS: 0.4000 mg | ORAL_CAPSULE | Freq: Every day | ORAL | 11 refills | Status: AC

## 2021-12-19 ENCOUNTER — Ambulatory Visit: Payer: Medicare HMO | Attending: Internal Medicine | Admitting: Physical Therapy

## 2021-12-26 ENCOUNTER — Encounter: Payer: Medicare HMO | Admitting: Physical Therapy

## 2021-12-26 ENCOUNTER — Ambulatory Visit: Payer: Medicare HMO | Admitting: Physical Therapy

## 2021-12-30 ENCOUNTER — Ambulatory Visit: Payer: Medicare HMO | Attending: Urology | Admitting: Physical Therapy

## 2021-12-30 ENCOUNTER — Ambulatory Visit: Payer: Medicare HMO | Admitting: Physical Therapy

## 2021-12-30 DIAGNOSIS — R278 Other lack of coordination: Secondary | ICD-10-CM | POA: Insufficient documentation

## 2021-12-30 DIAGNOSIS — R29898 Other symptoms and signs involving the musculoskeletal system: Secondary | ICD-10-CM | POA: Diagnosis not present

## 2021-12-30 DIAGNOSIS — R102 Pelvic and perineal pain: Secondary | ICD-10-CM | POA: Diagnosis not present
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
## 2022-01-01 ENCOUNTER — Ambulatory Visit: Payer: Medicare HMO | Admitting: Physical Therapy

## 2022-01-02 ENCOUNTER — Encounter: Payer: Medicare HMO | Admitting: Physical Therapy

## 2022-01-07 ENCOUNTER — Encounter: Payer: Self-pay | Admitting: Physical Therapy

## 2022-01-07 ENCOUNTER — Ambulatory Visit: Payer: Medicare HMO | Attending: Urology | Admitting: Physical Therapy

## 2022-01-07 DIAGNOSIS — R278 Other lack of coordination: Secondary | ICD-10-CM | POA: Insufficient documentation

## 2022-01-07 DIAGNOSIS — R102 Pelvic and perineal pain: Secondary | ICD-10-CM | POA: Diagnosis not present

## 2022-01-07 DIAGNOSIS — R29898 Other symptoms and signs involving the musculoskeletal system: Secondary | ICD-10-CM | POA: Diagnosis not present
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
## 2022-01-09 ENCOUNTER — Encounter: Payer: Medicare HMO | Admitting: Physical Therapy

## 2022-01-14 ENCOUNTER — Encounter: Payer: Self-pay | Admitting: Physical Therapy

## 2022-01-14 ENCOUNTER — Ambulatory Visit: Payer: Medicare HMO | Admitting: Physical Therapy

## 2022-01-14 DIAGNOSIS — R278 Other lack of coordination: Secondary | ICD-10-CM | POA: Diagnosis not present

## 2022-01-14 DIAGNOSIS — R29898 Other symptoms and signs involving the musculoskeletal system: Secondary | ICD-10-CM

## 2022-01-14 DIAGNOSIS — R102 Pelvic and perineal pain: Secondary | ICD-10-CM | POA: Diagnosis not present
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
## 2022-01-16 ENCOUNTER — Encounter: Payer: Medicare HMO | Admitting: Physical Therapy

## 2022-01-21 ENCOUNTER — Encounter: Payer: Self-pay | Admitting: Physical Therapy

## 2022-01-21 ENCOUNTER — Ambulatory Visit: Payer: Medicare HMO | Admitting: Physical Therapy

## 2022-01-21 DIAGNOSIS — R29898 Other symptoms and signs involving the musculoskeletal system: Secondary | ICD-10-CM | POA: Diagnosis not present

## 2022-01-21 DIAGNOSIS — R102 Pelvic and perineal pain: Secondary | ICD-10-CM

## 2022-01-21 DIAGNOSIS — R278 Other lack of coordination: Secondary | ICD-10-CM
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
## 2022-01-28 ENCOUNTER — Ambulatory Visit: Payer: Medicare HMO | Admitting: Physical Therapy

## 2022-01-28 ENCOUNTER — Encounter: Payer: Self-pay | Admitting: Physical Therapy

## 2022-01-28 DIAGNOSIS — R102 Pelvic and perineal pain: Secondary | ICD-10-CM

## 2022-01-28 DIAGNOSIS — R278 Other lack of coordination: Secondary | ICD-10-CM | POA: Diagnosis not present

## 2022-01-28 DIAGNOSIS — R29898 Other symptoms and signs involving the musculoskeletal system: Secondary | ICD-10-CM
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
## 2022-02-04 ENCOUNTER — Ambulatory Visit: Payer: Medicare HMO | Admitting: Physical Therapy

## 2022-02-19 ENCOUNTER — Ambulatory Visit: Payer: Medicare HMO | Attending: Urology | Admitting: Physical Therapy

## 2022-02-21 ENCOUNTER — Ambulatory Visit: Payer: Medicare HMO | Admitting: Urology

## 2022-02-21 NOTE — Progress Notes (Deleted)
02/21/2022 10:54 AM   [name] [birth year] [id number]  Referring provider: Tracie Harrier, MD [address] [facility] [place],  [place] [address]  No chief complaint on file.   HPI:    PMH: Past Medical History:  Diagnosis Date   Alcohol abuse    Arthritis    GERD (gastroesophageal reflux disease)    Hypertension     Surgical History: Past Surgical History:  Procedure Laterality Date   HIP FRACTURE SURGERY     SKIN GRAFT Left hand    Home Medications:  Allergies as of 02/21/2022       Reactions   Aspirin    Pt has stomach ulcers and reports he can not take ASA.         Medication List        Accurate as of February 21, 2022 10:54 AM. If you have any questions, ask your nurse or doctor.          acetaminophen 325 MG tablet Commonly known as: TYLENOL Take 325-650 mg by mouth every 6 (six) hours as needed for moderate pain. Reported on 10/05/2015   atorvastatin 10 MG tablet Commonly known as: LIPITOR Take by mouth.   HYDROcodone-acetaminophen 5-325 MG tablet Commonly known as: NORCO/VICODIN Take 1 tablet by mouth daily as needed.   hydrOXYzine 25 MG tablet Commonly known as: ATARAX Take 1 tablet (25 mg total) by mouth 3 (three) times daily as needed for anxiety.   losartan-hydrochlorothiazide 50-12.5 MG tablet Commonly known as: HYZAAR Take 1 tablet by mouth daily.   naproxen 500 MG tablet Commonly known as: Naprosyn Take 1 tablet (500 mg total) by mouth 2 (two) times daily with a meal.   ranitidine 150 MG capsule Commonly known as: ZANTAC Take 150 mg by mouth daily. Reported on 10/05/2015   sertraline 100 MG tablet Commonly known as: ZOLOFT Take 100 mg by mouth daily. Reported on 10/05/2015   Stool Softener 100 MG capsule Generic drug: docusate sodium Take 100 mg by mouth daily. Reported on 10/05/2015   tamsulosin 0.4 MG Caps capsule Commonly known as: FLOMAX Take 1 capsule (0.4 mg total) by mouth daily.    Vitamin D (Ergocalciferol) 1.25 MG (50000 UNIT) Caps capsule Commonly known as: DRISDOL Take 50,000 Units by mouth every 7 (seven) days. Reported on 10/05/2015        Allergies:  Allergies  Allergen Reactions   Aspirin     Pt has stomach ulcers and reports he can not take ASA.     Family History: Family History  Problem Relation Age of Onset   Diabetes Mother    Heart disease Mother    Hypertension Mother     Social History:  reports that he has been smoking cigarettes. He has been smoking an average of 1 pack per day. He has never used smokeless tobacco. He reports current alcohol use of about 1.0 standard drink of alcohol per week. He reports current drug use. Drug: Marijuana.   Physical Exam: There were no vitals taken for this visit.  Constitutional:  Alert and oriented, No acute distress. HEENT: [place] AT, moist mucus membranes.  Trachea midline, no masses. Cardiovascular: No clubbing, cyanosis, or edema. Respiratory: Normal respiratory effort, no increased work of breathing. GI: Abdomen is soft, nontender, nondistended, no abdominal masses GU: No CVA tenderness Skin: No rashes, bruises or suspicious lesions. Neurologic: Grossly intact, no focal deficits, moving all 4 extremities. Psychiatric: Normal mood and affect.  Laboratory Data: Lab Results  Component  Value Date   WBC 9.4 12/09/2019   HGB 14.8 12/09/2019   HCT 42.3 12/09/2019   MCV 96.8 12/09/2019   PLT 342 12/09/2019    Lab Results  Component Value Date   CREATININE 0.92 12/09/2019    No results found for: "PSA"  No results found for: "TESTOSTERONE"  No results found for: "HGBA1C"  Urinalysis    Component Value Date/Time   COLORURINE YELLOW 11/22/2021 1100   APPEARANCEUR CLEAR 11/22/2021 1100   LABSPEC <1.005 (L) 11/22/2021 1100   PHURINE 6.0 11/22/2021 1100   GLUCOSEU NEGATIVE 11/22/2021 1100   HGBUR NEGATIVE 11/22/2021 1100   BILIRUBINUR NEGATIVE 11/22/2021 1100   KETONESUR NEGATIVE  11/22/2021 1100   PROTEINUR NEGATIVE 11/22/2021 1100   NITRITE NEGATIVE 11/22/2021 1100   LEUKOCYTESUR NEGATIVE 11/22/2021 1100    Lab Results  Component Value Date   BACTERIA NONE SEEN 11/22/2021    Pertinent Imaging: *** No results found for this or any previous visit.  No results found for this or any previous visit.  No results found for this or any previous visit.  No results found for this or any previous visit.  No results found for this or any previous visit.  No valid procedures specified. No results found for this or any previous visit.  Results for orders placed during the hospital encounter of 10/02/20  CT Renal Stone Study  Narrative CLINICAL DATA:  61 year old male with right flank pain radiating to the groin intermittently for 3 days.  EXAM: CT ABDOMEN AND PELVIS WITHOUT CONTRAST  TECHNIQUE: Multidetector CT imaging of the abdomen and pelvis was performed following the standard protocol without IV contrast.  COMPARISON:  None.  FINDINGS: Lower chest: Minor right lung base atelectasis.  Otherwise negative.  Hepatobiliary: Negative noncontrast liver. Layering sludge is evident within the gallbladder on series 2, image 30. No pericholecystic inflammation.  Pancreas: Negative noncontrast pancreas.  Spleen: Diminutive, negative.  Adrenals/Urinary Tract: Normal adrenal glands.  No hydronephrosis or nephrolithiasis. Noncontrast kidneys appear symmetric. Both proximal ureters appear decompressed. And the distal right ureter appears normal at the UVJ on series 2, image 66. There are bilateral pelvic phleboliths. The urinary bladder is decompressed and unremarkable.  Stomach/Bowel: Negative large bowel aside from some redundancy and occasional diverticula. Normal appendix on series 2, image 52 and coronal image 69. Negative terminal ileum. No dilated small bowel. Decompressed and negative stomach. No free air, free fluid.  Vascular/Lymphatic:  Aortoiliac calcified atherosclerosis. Normal caliber abdominal aorta. Vascular patency is not evaluated in the absence of IV contrast. No lymphadenopathy.  Reproductive: Negative.  Other: No pelvic free fluid.  Musculoskeletal: Previous left femur ORIF. Mild lumbar disc bulging. No acute osseous abnormality identified.  IMPRESSION: 1. Layering sludge within the gallbladder, but no noncontrast CT evidence of acute cholecystitis. 2. No urinary calculus or obstructive uropathy.  Normal appendix. 3. Aortic Atherosclerosis (ICD10-I70.0).   Electronically Signed By: Genevie Ann M.D. On: 10/02/2020 08:40   Assessment & Plan:    There are no diagnoses linked to this encounter.  No follow-ups on file.  Hollice Espy, MD  [facility] [address] [place], [place] [address] [phone]

## 2022-03-18 DIAGNOSIS — G8929 Other chronic pain: Secondary | ICD-10-CM | POA: Diagnosis not present

## 2022-03-18 DIAGNOSIS — F1090 Alcohol use, unspecified, uncomplicated: Secondary | ICD-10-CM | POA: Diagnosis not present

## 2022-03-18 DIAGNOSIS — I10 Essential (primary) hypertension: Secondary | ICD-10-CM | POA: Diagnosis not present

## 2022-03-18 DIAGNOSIS — Z789 Other specified health status: Secondary | ICD-10-CM | POA: Diagnosis not present

## 2022-03-18 DIAGNOSIS — M5416 Radiculopathy, lumbar region: Secondary | ICD-10-CM | POA: Diagnosis not present

## 2022-03-18 DIAGNOSIS — R7989 Other specified abnormal findings of blood chemistry: Secondary | ICD-10-CM | POA: Diagnosis not present

## 2022-03-18 DIAGNOSIS — Z0000 Encounter for general adult medical examination without abnormal findings: Secondary | ICD-10-CM | POA: Diagnosis not present

## 2022-03-18 DIAGNOSIS — Z2821 Immunization not carried out because of patient refusal: Secondary | ICD-10-CM | POA: Diagnosis not present

## 2022-03-18 DIAGNOSIS — R4589 Other symptoms and signs involving emotional state: Secondary | ICD-10-CM | POA: Diagnosis not present

## 2022-03-18 DIAGNOSIS — B351 Tinea unguium: Secondary | ICD-10-CM | POA: Diagnosis not present

## 2022-03-18 DIAGNOSIS — F17200 Nicotine dependence, unspecified, uncomplicated: Secondary | ICD-10-CM | POA: Diagnosis not present

## 2022-03-18 DIAGNOSIS — N5312 Painful ejaculation: Secondary | ICD-10-CM | POA: Diagnosis not present

## 2022-03-18 DIAGNOSIS — F17210 Nicotine dependence, cigarettes, uncomplicated: Secondary | ICD-10-CM | POA: Diagnosis not present

## 2022-03-18 DIAGNOSIS — Z87828 Personal history of other (healed) physical injury and trauma: Secondary | ICD-10-CM | POA: Diagnosis not present

## 2022-03-18 DIAGNOSIS — M545 Low back pain, unspecified: Secondary | ICD-10-CM | POA: Diagnosis not present

## 2022-03-18 DIAGNOSIS — Z1331 Encounter for screening for depression: Secondary | ICD-10-CM | POA: Diagnosis not present

## 2022-03-18 DIAGNOSIS — E538 Deficiency of other specified B group vitamins: Secondary | ICD-10-CM | POA: Diagnosis not present

## 2022-03-18 DIAGNOSIS — F1290 Cannabis use, unspecified, uncomplicated: Secondary | ICD-10-CM | POA: Diagnosis not present

## 2022-03-18 DIAGNOSIS — Z125 Encounter for screening for malignant neoplasm of prostate: Secondary | ICD-10-CM | POA: Diagnosis not present

## 2022-04-22 DIAGNOSIS — R768 Other specified abnormal immunological findings in serum: Secondary | ICD-10-CM | POA: Diagnosis not present

## 2022-04-22 DIAGNOSIS — M545 Low back pain, unspecified: Secondary | ICD-10-CM | POA: Diagnosis not present

## 2022-04-22 DIAGNOSIS — F1090 Alcohol use, unspecified, uncomplicated: Secondary | ICD-10-CM | POA: Diagnosis not present

## 2022-04-22 DIAGNOSIS — G8929 Other chronic pain: Secondary | ICD-10-CM | POA: Diagnosis not present

## 2022-07-02 DIAGNOSIS — F1290 Cannabis use, unspecified, uncomplicated: Secondary | ICD-10-CM | POA: Diagnosis not present

## 2022-07-02 DIAGNOSIS — F1090 Alcohol use, unspecified, uncomplicated: Secondary | ICD-10-CM | POA: Diagnosis not present

## 2022-07-02 DIAGNOSIS — F17200 Nicotine dependence, unspecified, uncomplicated: Secondary | ICD-10-CM | POA: Diagnosis not present

## 2022-07-02 DIAGNOSIS — N342 Other urethritis: Secondary | ICD-10-CM | POA: Diagnosis not present

## 2022-07-02 DIAGNOSIS — R2681 Unsteadiness on feet: Secondary | ICD-10-CM | POA: Diagnosis not present

## 2022-07-02 DIAGNOSIS — Z2821 Immunization not carried out because of patient refusal: Secondary | ICD-10-CM | POA: Diagnosis not present

## 2022-07-02 DIAGNOSIS — I10 Essential (primary) hypertension: Secondary | ICD-10-CM | POA: Diagnosis not present

## 2022-07-02 DIAGNOSIS — R7309 Other abnormal glucose: Secondary | ICD-10-CM | POA: Diagnosis not present

## 2022-07-02 DIAGNOSIS — M5416 Radiculopathy, lumbar region: Secondary | ICD-10-CM | POA: Diagnosis not present

## 2022-07-09 ENCOUNTER — Other Ambulatory Visit: Payer: Self-pay | Admitting: Internal Medicine

## 2022-07-09 DIAGNOSIS — Z114 Encounter for screening for human immunodeficiency virus [HIV]: Secondary | ICD-10-CM | POA: Diagnosis not present

## 2022-07-09 DIAGNOSIS — Z113 Encounter for screening for infections with a predominantly sexual mode of transmission: Secondary | ICD-10-CM | POA: Diagnosis not present

## 2022-07-09 DIAGNOSIS — F17210 Nicotine dependence, cigarettes, uncomplicated: Secondary | ICD-10-CM | POA: Diagnosis not present

## 2022-07-09 DIAGNOSIS — Z0000 Encounter for general adult medical examination without abnormal findings: Secondary | ICD-10-CM | POA: Diagnosis not present

## 2022-07-09 DIAGNOSIS — G8929 Other chronic pain: Secondary | ICD-10-CM | POA: Diagnosis not present

## 2022-07-09 DIAGNOSIS — M79671 Pain in right foot: Secondary | ICD-10-CM | POA: Diagnosis not present

## 2022-07-09 DIAGNOSIS — R053 Chronic cough: Secondary | ICD-10-CM | POA: Diagnosis not present

## 2022-07-09 DIAGNOSIS — Z87828 Personal history of other (healed) physical injury and trauma: Secondary | ICD-10-CM | POA: Diagnosis not present

## 2022-07-09 DIAGNOSIS — R2681 Unsteadiness on feet: Secondary | ICD-10-CM | POA: Diagnosis not present

## 2022-07-09 DIAGNOSIS — M5416 Radiculopathy, lumbar region: Secondary | ICD-10-CM | POA: Diagnosis not present

## 2022-07-09 DIAGNOSIS — L603 Nail dystrophy: Secondary | ICD-10-CM | POA: Diagnosis not present

## 2022-07-09 DIAGNOSIS — I10 Essential (primary) hypertension: Secondary | ICD-10-CM | POA: Diagnosis not present

## 2022-07-09 DIAGNOSIS — M79672 Pain in left foot: Secondary | ICD-10-CM | POA: Diagnosis not present

## 2022-07-09 DIAGNOSIS — N50819 Testicular pain, unspecified: Secondary | ICD-10-CM | POA: Diagnosis not present

## 2022-07-09 DIAGNOSIS — F1090 Alcohol use, unspecified, uncomplicated: Secondary | ICD-10-CM | POA: Diagnosis not present

## 2022-07-15 ENCOUNTER — Ambulatory Visit
Admission: RE | Admit: 2022-07-15 | Discharge: 2022-07-15 | Disposition: A | Discharge status: Home / Self Care | Payer: Medicare HMO | Source: Ambulatory Visit | Attending: Internal Medicine | Admitting: Internal Medicine

## 2022-07-15 DIAGNOSIS — N50819 Testicular pain, unspecified: Secondary | ICD-10-CM | POA: Diagnosis not present

## 2022-07-15 DIAGNOSIS — N50812 Left testicular pain: Secondary | ICD-10-CM | POA: Diagnosis not present

## 2022-07-15 DIAGNOSIS — Z113 Encounter for screening for infections with a predominantly sexual mode of transmission: Secondary | ICD-10-CM | POA: Diagnosis not present

## 2022-07-15 DIAGNOSIS — G8929 Other chronic pain: Secondary | ICD-10-CM | POA: Diagnosis not present

## 2022-07-30 DIAGNOSIS — M79671 Pain in right foot: Secondary | ICD-10-CM | POA: Diagnosis not present

## 2022-07-30 DIAGNOSIS — M79672 Pain in left foot: Secondary | ICD-10-CM | POA: Diagnosis not present

## 2022-09-10 ENCOUNTER — Other Ambulatory Visit: Payer: Self-pay | Admitting: *Deleted

## 2022-09-10 DIAGNOSIS — Z87891 Personal history of nicotine dependence: Secondary | ICD-10-CM

## 2022-09-10 DIAGNOSIS — F17210 Nicotine dependence, cigarettes, uncomplicated: Secondary | ICD-10-CM

## 2022-09-10 DIAGNOSIS — Z122 Encounter for screening for malignant neoplasm of respiratory organs: Secondary | ICD-10-CM

## 2022-10-07 ENCOUNTER — Ambulatory Visit (INDEPENDENT_AMBULATORY_CARE_PROVIDER_SITE_OTHER): Payer: Medicare HMO | Admitting: Primary Care

## 2022-10-07 DIAGNOSIS — F17210 Nicotine dependence, cigarettes, uncomplicated: Secondary | ICD-10-CM

## 2022-10-07 DIAGNOSIS — F17200 Nicotine dependence, unspecified, uncomplicated: Secondary | ICD-10-CM

## 2022-10-07 NOTE — Progress Notes (Signed)
Virtual Visit via Telephone Note  I connected with Loman Chroman on 10/07/22 at  1:30 PM EDT by telephone and verified that I am speaking with the correct person using two identifiers.  Location: Patient: Home Provider: Office   I discussed the limitations, risks, security and privacy concerns of performing an evaluation and management service by telephone and the availability of in person appointments. I also discussed with the patient that there may be a patient responsible charge related to this service. The patient expressed understanding and agreed to proceed.  Shared Decision Making Visit Lung Cancer Screening Program [phone])   Eligibility: Age 62 y.o. Pack Years Smoking History Calculation 55 (# packs/per year x # years smoked) Recent History of coughing up blood  no Unexplained weight loss? no ( >Than 15 pounds within the last 6 months ) Prior History Lung / other cancer no (Diagnosis within the last 5 years already requiring surveillance chest CT Scans). Smoking Status Current Smoker Former Smokers: Years since quit: < 1 year  Quit Date: NA   Visit Components: Discussion included one or more decision making aids. yes Discussion included risk/benefits of screening. yes Discussion included potential follow up diagnostic testing for abnormal scans. yes Discussion included meaning and risk of over diagnosis. yes Discussion included meaning and risk of False Positives. yes Discussion included meaning of total radiation exposure. yes  Counseling Included: Importance of adherence to annual lung cancer LDCT screening. yes Impact of comorbidities on ability to participate in the program. yes Ability and willingness to under diagnostic treatment. yes  Smoking Cessation Counseling: Current Smokers:  Discussed importance of smoking cessation. yes Information about tobacco cessation classes and interventions provided to patient. yes Patient provided with "ticket" for LDCT  Scan. NA Symptomatic Patient. no  Counseling(Intermediate counseling: > three minutes) [address]406 Diagnosis Code: Tobacco Use Z72.0 Asymptomatic Patient yes  Counseling (Intermediate counseling: > three minutes counseling) Z3086 Former Smokers:  Discussed the importance of maintaining cigarette abstinence. yes Diagnosis Code: Personal History of Nicotine Dependence. V78.469 Information about tobacco cessation classes and interventions provided to patient. Yes Patient provided with "ticket" for LDCT Scan. NA Written Order for Lung Cancer Screening with LDCT placed in Epic. Yes (CT Chest Lung Cancer Screening Low Dose W/O CM) [id number] Z12.2-Screening of respiratory organs Z87.891-Personal history of nicotine dependence  I have spent 25 minutes of face to face/ virtual visit time with Mr Kalicki and his daughter Macauley Polishchuk discussing the risks and benefits of lung cancer screening. We viewed / discussed a power point together that explained in detail the above noted topics. We paused at intervals to allow for questions to be asked and answered to ensure understanding.We discussed that the single most powerful action that he can take to decrease his risk of developing lung cancer is to quit smoking. We discussed whether or not he is ready to commit to setting a quit date. We discussed options for tools to aid in quitting smoking including nicotine replacement therapy, non-nicotine medications, support groups, Quit Smart classes, and behavior modification. We discussed that often times setting smaller, more achievable goals, such as eliminating 1 cigarette a day for a week and then 2 cigarettes a day for a week can be helpful in slowly decreasing the number of cigarettes smoked. This allows for a sense of accomplishment as well as providing a clinical benefit. I provided him  with smoking cessation  information  with contact information for community resources, classes, free nicotine replacement therapy, and  access to mobile  apps, text messaging, and on-line smoking cessation help. I have also provided him  the office contact information in the event he needs to contact me, or the screening staff. We discussed the time and location of the scan, and that either Abigail Miyamoto RN, Karlton Lemon, RN  or I will call / send a letter with the results within 24-72 hours of receiving them. The patient verbalized understanding of all of  the above and had no further questions upon leaving the office. They have my contact information in the event they have any further questions.  I spent 3-5 minutes counseling on smoking cessation and the health risks of continued tobacco abuse.  I explained to the patient that there has been a high incidence of coronary artery disease noted on these exams. I explained that this is a non-gated exam therefore degree or severity cannot be determined. This patient is on statin therapy. I have asked the patient to follow-up with their PCP regarding any incidental finding of coronary artery disease and management with diet or medication as their PCP  feels is clinically indicated. The patient verbalized understanding of the above and had no further questions upon completion of the visit.  Medical history significant for traumatic brain injury. Contact patient/and his daughter Tee [place]up [phone]) to review any results if needed. Unable to verify all medications, his daughter states that she thinks he takes most of them daily but might miss a couple of days. Patient states that he has lost weight but reviewed medical chart and his weight was documented at 164lbs in December 2023.   Glenford Bayley, NP

## 2022-10-07 NOTE — Patient Instructions (Signed)
Thank you for participating in the [facility]. It was our pleasure to meet you today. We will call you with the results of your scan within the next few days. Your scan will be assigned a Lung RADS category score by the physicians reading the scans.  This Lung RADS score determines follow up scanning.  See below for description of categories, and follow up screening recommendations. We will be in touch to schedule your follow up screening annually or based on recommendations of our providers. We will fax a copy of your scan results to your Primary Care Physician, or the physician who referred you to the program, to ensure they have the results. Please call the office if you have any questions or concerns regarding your scanning experience or results.  Our office number is [phone]. Please speak with Denise Phelps, RN. , or  Denise Buckner RN, They are  our Lung Cancer Screening RN.'s If They are unavailable when you call, Please leave a message on the voice mail. We will return your call at our earliest convenience.This voice mail is monitored several times a day.  Remember, if your scan is normal, we will scan you annually as long as you continue to meet the criteria for the program. (Age 50-80, Current smoker or smoker who has quit within the last 15 years). If you are a smoker, remember, quitting is the single most powerful action that you can take to decrease your risk of lung cancer and other pulmonary, breathing related problems. We know quitting is hard, and we are here to help.  Please let us know if there is anything we can do to help you meet your goal of quitting. If you are a former smoker, congratulations. We are proud of you! Remain smoke free! Remember you can refer friends or family members through the number above.  We will screen them to make sure they meet criteria for the program. Thank you for helping us take better care of you by  participating in Lung Screening.  You can receive free nicotine replacement therapy ( patches, gum or mints) by calling 1-800-QUIT NOW. Please call so we can get you on the path to becoming  a non-smoker. I know it is hard, but you can do this!  Lung RADS Categories:  Lung RADS 1: no nodules or definitely non-concerning nodules.  Recommendation is for a repeat annual scan in 12 months.  Lung RADS 2:  nodules that are non-concerning in appearance and behavior with a very low likelihood of becoming an active cancer. Recommendation is for a repeat annual scan in 12 months.  Lung RADS 3: nodules that are probably non-concerning , includes nodules with a low likelihood of becoming an active cancer.  Recommendation is for a 6-month repeat screening scan. Often noted after an upper respiratory illness. We will be in touch to make sure you have no questions, and to schedule your 6-month scan.  Lung RADS 4 A: nodules with concerning findings, recommendation is most often for a follow up scan in 3 months or additional testing based on our provider's assessment of the scan. We will be in touch to make sure you have no questions and to schedule the recommended 3 month follow up scan.  Lung RADS 4 B:  indicates findings that are concerning. We will be in touch with you to schedule additional diagnostic testing based on our provider's  assessment of the scan.  Other options for assistance in smoking cessation (   As covered by your insurance benefits)  Hypnosis for smoking cessation  [employer] [phone]  Acupuncture for smoking cessation  [facility] [phone]   

## 2022-10-13 ENCOUNTER — Ambulatory Visit
Admission: RE | Admit: 2022-10-13 | Discharge: 2022-10-13 | Disposition: A | Discharge status: Home / Self Care | Payer: Medicare HMO | Source: Ambulatory Visit | Attending: Acute Care | Admitting: Acute Care

## 2022-10-13 DIAGNOSIS — Z122 Encounter for screening for malignant neoplasm of respiratory organs: Secondary | ICD-10-CM | POA: Diagnosis not present

## 2022-10-13 DIAGNOSIS — F17210 Nicotine dependence, cigarettes, uncomplicated: Secondary | ICD-10-CM | POA: Insufficient documentation

## 2022-10-13 DIAGNOSIS — Z87891 Personal history of nicotine dependence: Secondary | ICD-10-CM | POA: Diagnosis not present

## 2022-10-16 DIAGNOSIS — R1310 Dysphagia, unspecified: Secondary | ICD-10-CM | POA: Diagnosis not present

## 2022-10-16 DIAGNOSIS — Z86010 Personal history of colonic polyps: Secondary | ICD-10-CM | POA: Diagnosis not present

## 2022-10-16 DIAGNOSIS — R197 Diarrhea, unspecified: Secondary | ICD-10-CM | POA: Diagnosis not present

## 2022-10-16 DIAGNOSIS — R7989 Other specified abnormal findings of blood chemistry: Secondary | ICD-10-CM | POA: Diagnosis not present

## 2022-10-16 DIAGNOSIS — R634 Abnormal weight loss: Secondary | ICD-10-CM | POA: Diagnosis not present

## 2022-10-16 DIAGNOSIS — F1020 Alcohol dependence, uncomplicated: Secondary | ICD-10-CM | POA: Diagnosis not present

## 2022-10-17 ENCOUNTER — Other Ambulatory Visit: Payer: Self-pay | Admitting: Acute Care

## 2022-10-17 DIAGNOSIS — F17210 Nicotine dependence, cigarettes, uncomplicated: Secondary | ICD-10-CM

## 2022-10-17 DIAGNOSIS — Z122 Encounter for screening for malignant neoplasm of respiratory organs: Secondary | ICD-10-CM

## 2022-10-17 DIAGNOSIS — Z87891 Personal history of nicotine dependence: Secondary | ICD-10-CM

## 2022-10-29 DIAGNOSIS — R634 Abnormal weight loss: Secondary | ICD-10-CM | POA: Diagnosis not present

## 2022-10-29 DIAGNOSIS — R369 Urethral discharge, unspecified: Secondary | ICD-10-CM | POA: Diagnosis not present

## 2022-10-29 DIAGNOSIS — R300 Dysuria: Secondary | ICD-10-CM | POA: Diagnosis not present

## 2022-10-29 DIAGNOSIS — Z113 Encounter for screening for infections with a predominantly sexual mode of transmission: Secondary | ICD-10-CM | POA: Diagnosis not present

## 2022-10-29 DIAGNOSIS — R7989 Other specified abnormal findings of blood chemistry: Secondary | ICD-10-CM | POA: Diagnosis not present

## 2022-12-31 IMAGING — CT CT RENAL STONE PROTOCOL
2 of 4 series · 16 of 46 positions shown, 18 images · non-contrast
Comparison: None.

CLINICAL DATA: 59-year-old male with right flank pain radiating to
the groin intermittently for 3 days.

EXAM:
CT ABDOMEN AND PELVIS WITHOUT CONTRAST
TECHNIQUE: Multidetector CT imaging of the abdomen and pelvis was performed
following the standard protocol without IV contrast.

[Series 2: stone full standard · axial · 0.71mm/px · z∈[-512,-97]mm · 13 of 91 slices shown, 15 images]
[im 4/91  soft-tissue]
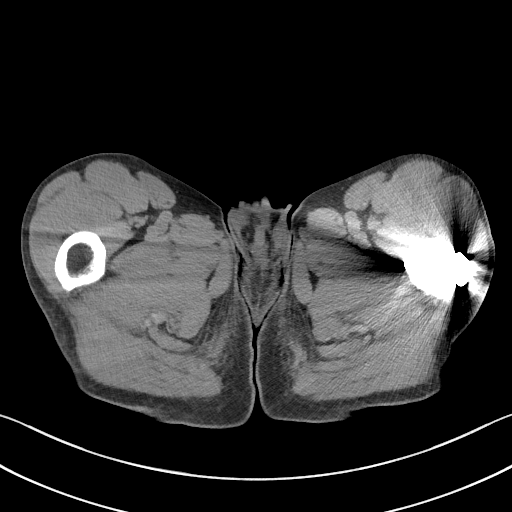
[im 4/91  bone]
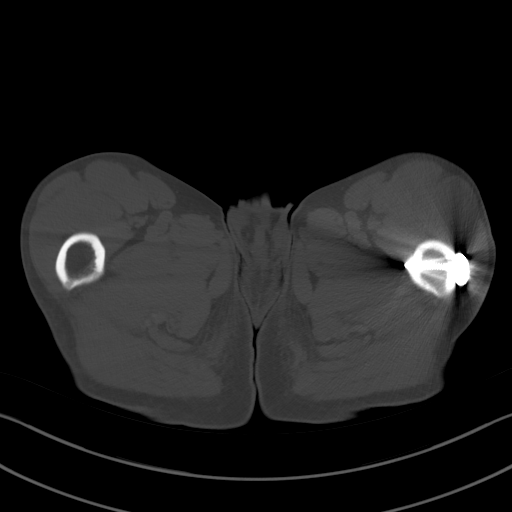
[im 12/91  soft-tissue]
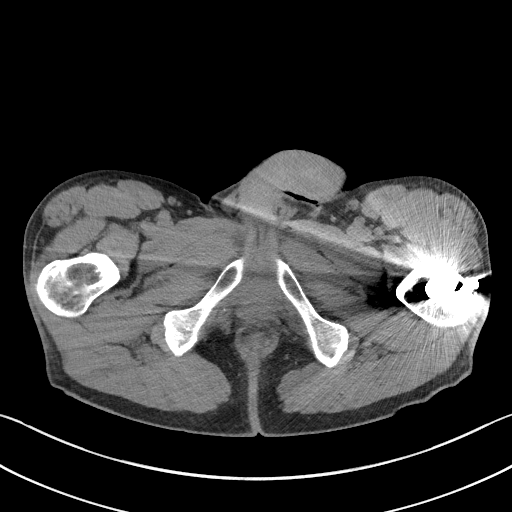
[im 19/91  soft-tissue]
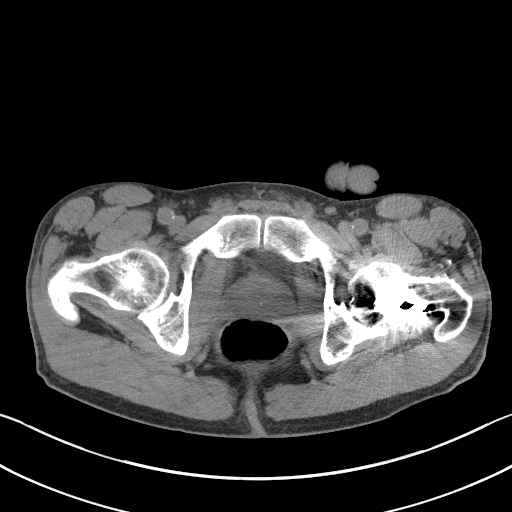
[im 27/91  soft-tissue]
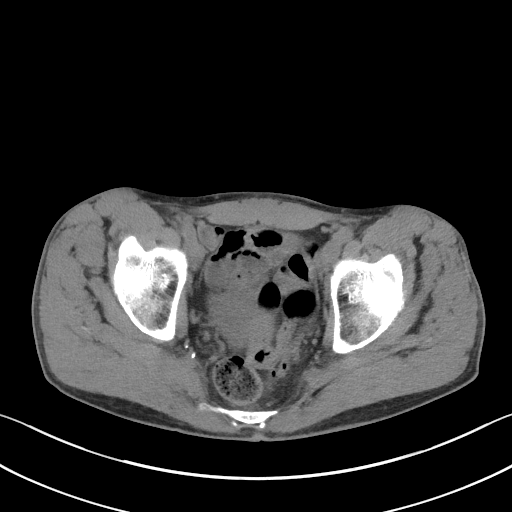
[im 31/91  soft-tissue]
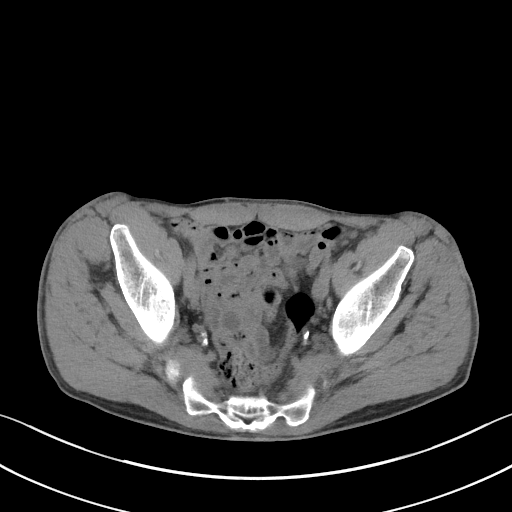
[im 38/91  soft-tissue]
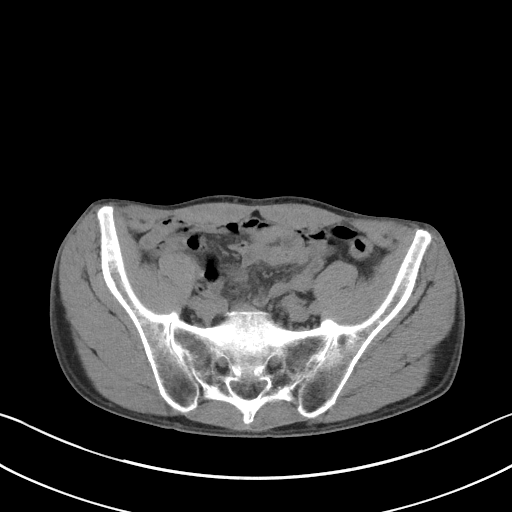
[im 46/91  soft-tissue]
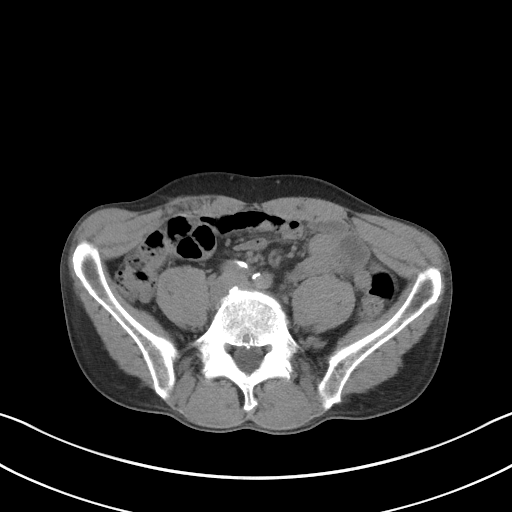
[im 53/91  soft-tissue]
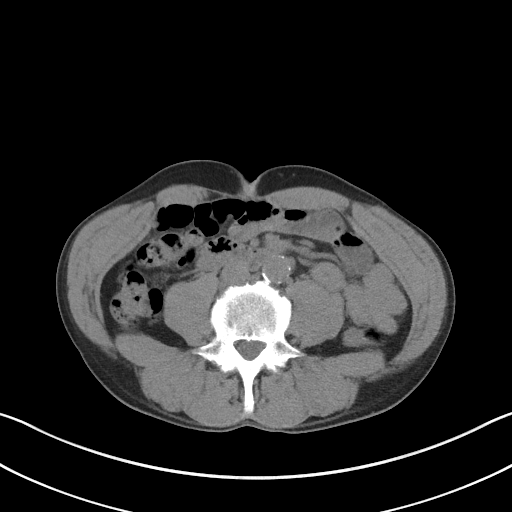
[im 61/91  soft-tissue]
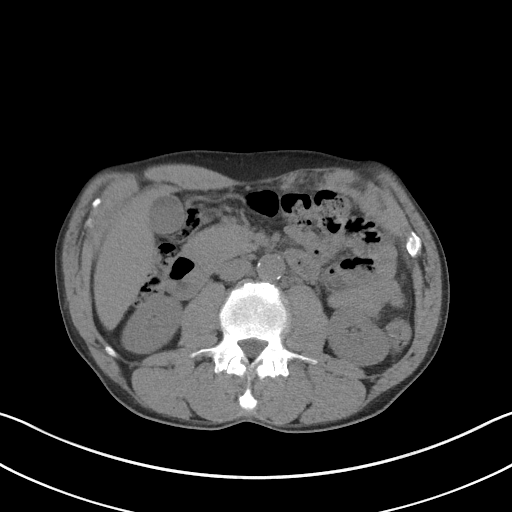
[im 61/91  bone]
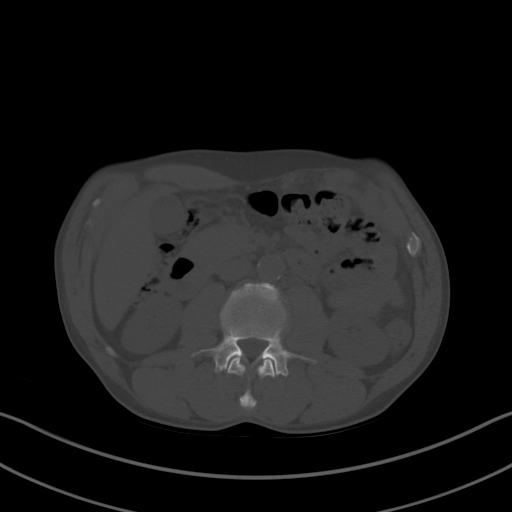
[im 64/91  soft-tissue]
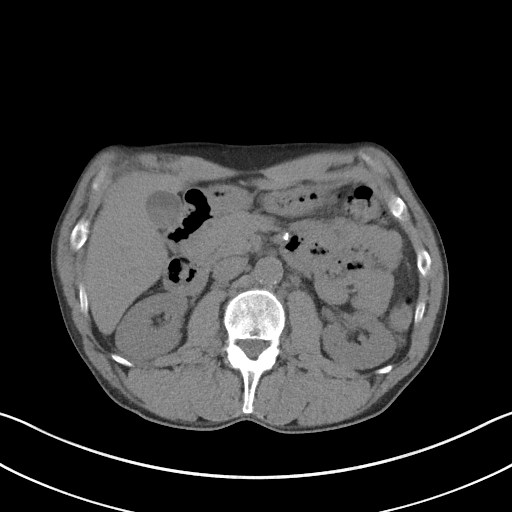
[im 72/91  soft-tissue]
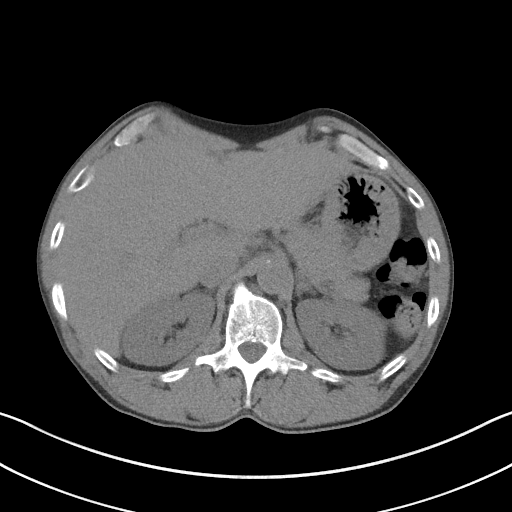
[im 79/91  soft-tissue]
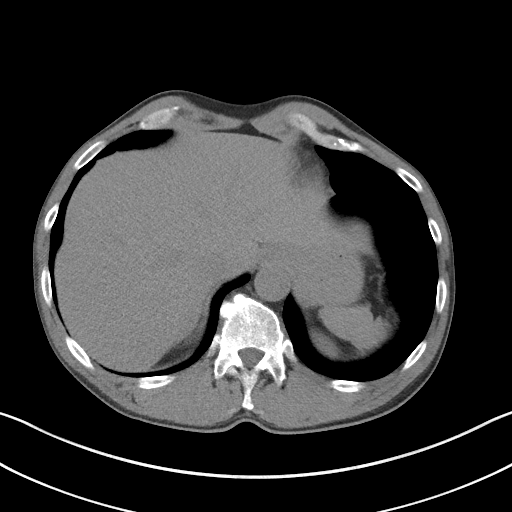
[im 87/91  soft-tissue]
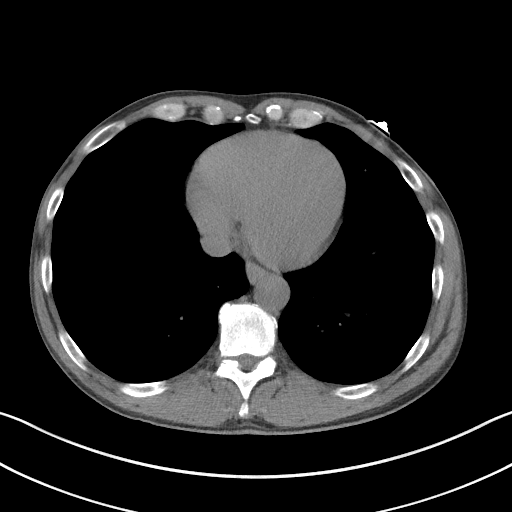

[Series 5: coronal · coronal · 0.75mm/px · 3 of 126 slices shown]
[im 42/126  soft-tissue]
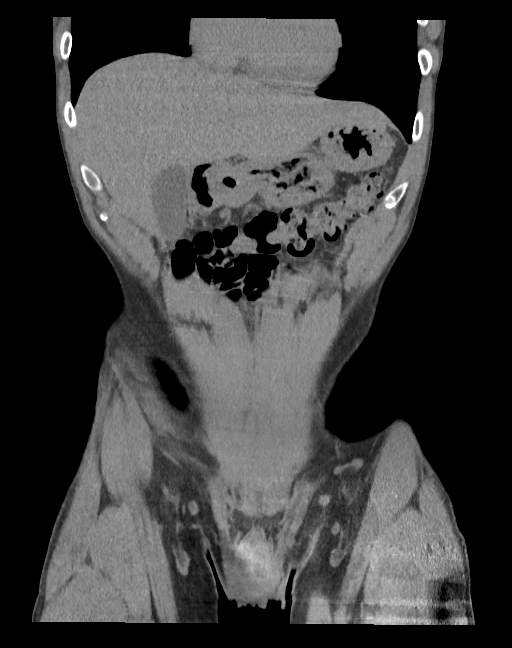
[im 56/126  soft-tissue]
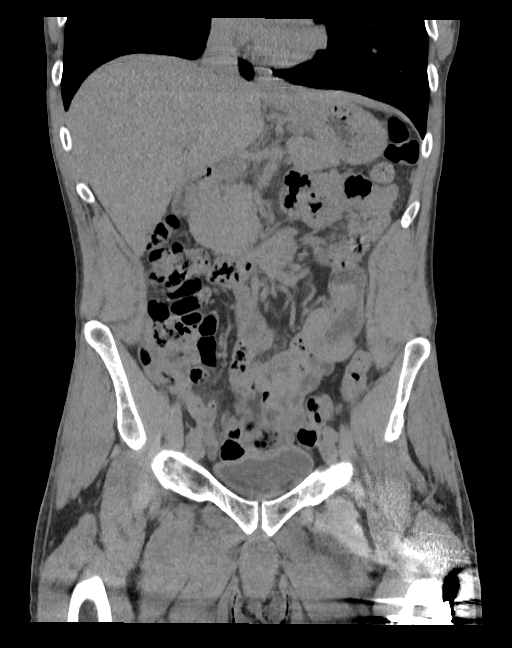
[im 70/126  soft-tissue]
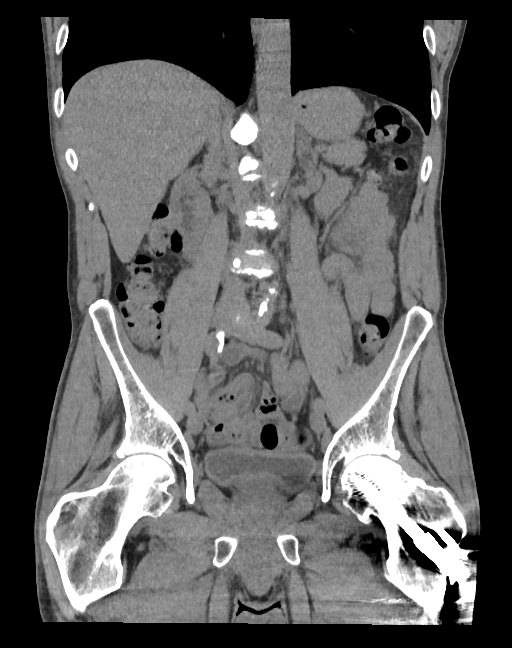

[16 of 46 positions shown; findings below may reference images not displayed]

FINDINGS: Lower chest: Minor right lung base atelectasis.  Otherwise negative.

Hepatobiliary: Negative noncontrast liver. Layering sludge is
evident within the gallbladder on series 2, image 30. No
pericholecystic inflammation.

Pancreas: Negative noncontrast pancreas.

Spleen: Diminutive, negative.

Adrenals/Urinary Tract: Normal adrenal glands.

No hydronephrosis or nephrolithiasis. Noncontrast kidneys appear
symmetric. Both proximal ureters appear decompressed. And the distal
right ureter appears normal at the UVJ on series 2, image 66. There
are bilateral pelvic phleboliths. The urinary bladder is
decompressed and unremarkable.

Stomach/Bowel: Negative large bowel aside from some redundancy and
occasional diverticula. Normal appendix on series 2, image 52 and
coronal image 69. Negative terminal ileum. No dilated small bowel.
Decompressed and negative stomach. No free air, free fluid.

Vascular/Lymphatic: Aortoiliac calcified atherosclerosis. Normal
caliber abdominal aorta. Vascular patency is not evaluated in the
absence of IV contrast. No lymphadenopathy.

Reproductive: Negative.

Other: No pelvic free fluid.

Musculoskeletal: Previous left femur ORIF. Mild lumbar disc bulging.
No acute osseous abnormality identified.
IMPRESSION: 1. Layering sludge within the gallbladder, but no noncontrast CT
evidence of acute cholecystitis.
2. No urinary calculus or obstructive uropathy.  Normal appendix.
3. Aortic Atherosclerosis (25C64-S88.8).

## 2023-01-09 DIAGNOSIS — I10 Essential (primary) hypertension: Secondary | ICD-10-CM | POA: Diagnosis not present

## 2023-01-09 DIAGNOSIS — F1090 Alcohol use, unspecified, uncomplicated: Secondary | ICD-10-CM | POA: Diagnosis not present

## 2023-01-09 DIAGNOSIS — Z9181 History of falling: Secondary | ICD-10-CM | POA: Diagnosis not present

## 2023-01-09 DIAGNOSIS — R0781 Pleurodynia: Secondary | ICD-10-CM | POA: Diagnosis not present

## 2023-01-09 DIAGNOSIS — F32A Depression, unspecified: Secondary | ICD-10-CM | POA: Diagnosis not present

## 2023-01-09 DIAGNOSIS — Z87828 Personal history of other (healed) physical injury and trauma: Secondary | ICD-10-CM | POA: Diagnosis not present

## 2023-01-09 DIAGNOSIS — F1290 Cannabis use, unspecified, uncomplicated: Secondary | ICD-10-CM | POA: Diagnosis not present

## 2023-01-12 ENCOUNTER — Ambulatory Visit
Admission: RE | Admit: 2023-01-12 | Discharge: 2023-01-12 | Disposition: A | Discharge status: Home / Self Care | Payer: Medicare HMO | Attending: Gastroenterology | Admitting: Gastroenterology

## 2023-01-12 ENCOUNTER — Ambulatory Visit: Payer: Medicare HMO | Admitting: Anesthesiology

## 2023-01-12 ENCOUNTER — Encounter: Payer: Self-pay | Admitting: Gastroenterology

## 2023-01-12 ENCOUNTER — Encounter
Admission: RE | Disposition: A | Discharge status: Home / Self Care | Payer: Self-pay | Source: Home / Self Care | Attending: Gastroenterology

## 2023-01-12 DIAGNOSIS — K31A19 Gastric intestinal metaplasia without dysplasia, unspecified site: Secondary | ICD-10-CM | POA: Diagnosis not present

## 2023-01-12 DIAGNOSIS — K3189 Other diseases of stomach and duodenum: Secondary | ICD-10-CM | POA: Diagnosis not present

## 2023-01-12 DIAGNOSIS — D125 Benign neoplasm of sigmoid colon: Secondary | ICD-10-CM | POA: Insufficient documentation

## 2023-01-12 DIAGNOSIS — K2980 Duodenitis without bleeding: Secondary | ICD-10-CM | POA: Insufficient documentation

## 2023-01-12 DIAGNOSIS — K319 Disease of stomach and duodenum, unspecified: Secondary | ICD-10-CM | POA: Diagnosis not present

## 2023-01-12 DIAGNOSIS — K640 First degree hemorrhoids: Secondary | ICD-10-CM | POA: Diagnosis not present

## 2023-01-12 DIAGNOSIS — D126 Benign neoplasm of colon, unspecified: Secondary | ICD-10-CM | POA: Diagnosis not present

## 2023-01-12 DIAGNOSIS — K2970 Gastritis, unspecified, without bleeding: Secondary | ICD-10-CM | POA: Diagnosis not present

## 2023-01-12 DIAGNOSIS — Z86010 Personal history of colonic polyps: Secondary | ICD-10-CM | POA: Diagnosis not present

## 2023-01-12 DIAGNOSIS — K259 Gastric ulcer, unspecified as acute or chronic, without hemorrhage or perforation: Secondary | ICD-10-CM | POA: Insufficient documentation

## 2023-01-12 DIAGNOSIS — R1310 Dysphagia, unspecified: Secondary | ICD-10-CM | POA: Diagnosis not present

## 2023-01-12 DIAGNOSIS — K31A11 Gastric intestinal metaplasia without dysplasia, involving the antrum: Secondary | ICD-10-CM | POA: Diagnosis not present

## 2023-01-12 DIAGNOSIS — K449 Diaphragmatic hernia without obstruction or gangrene: Secondary | ICD-10-CM | POA: Diagnosis not present

## 2023-01-12 DIAGNOSIS — F17210 Nicotine dependence, cigarettes, uncomplicated: Secondary | ICD-10-CM | POA: Insufficient documentation

## 2023-01-12 DIAGNOSIS — K5730 Diverticulosis of large intestine without perforation or abscess without bleeding: Secondary | ICD-10-CM | POA: Insufficient documentation

## 2023-01-12 DIAGNOSIS — Z1211 Encounter for screening for malignant neoplasm of colon: Secondary | ICD-10-CM | POA: Insufficient documentation

## 2023-01-12 DIAGNOSIS — K635 Polyp of colon: Secondary | ICD-10-CM | POA: Diagnosis not present

## 2023-01-12 DIAGNOSIS — D124 Benign neoplasm of descending colon: Secondary | ICD-10-CM | POA: Insufficient documentation

## 2023-01-12 DIAGNOSIS — Z09 Encounter for follow-up examination after completed treatment for conditions other than malignant neoplasm: Secondary | ICD-10-CM | POA: Diagnosis not present

## 2023-01-12 DIAGNOSIS — K2990 Gastroduodenitis, unspecified, without bleeding: Secondary | ICD-10-CM | POA: Diagnosis not present

## 2023-01-12 HISTORY — PX: BIOPSY: SHX5522

## 2023-01-12 HISTORY — PX: COLONOSCOPY WITH PROPOFOL: SHX5780

## 2023-01-12 HISTORY — PX: ESOPHAGOGASTRODUODENOSCOPY (EGD) WITH PROPOFOL: SHX5813

## 2023-01-12 HISTORY — PX: MALONEY DILATION: SHX5535

## 2023-01-12 SURGERY — COLONOSCOPY WITH PROPOFOL
Anesthesia: General

## 2023-01-12 MED ORDER — PROPOFOL 10 MG/ML IV BOLUS
INTRAVENOUS | Status: DC | PRN
  Administered 2023-01-12: 20 mg via INTRAVENOUS
  Administered 2023-01-12: 40 mg via INTRAVENOUS

## 2023-01-12 MED ORDER — PROPOFOL 500 MG/50ML IV EMUL
INTRAVENOUS | Status: DC | PRN
  Administered 2023-01-12: 100 ug/kg/min via INTRAVENOUS

## 2023-01-12 MED ORDER — LIDOCAINE HCL (CARDIAC) PF 100 MG/5ML IV SOSY
PREFILLED_SYRINGE | INTRAVENOUS | Status: DC | PRN
  Administered 2023-01-12: 70 mg via INTRAVENOUS

## 2023-01-12 MED ORDER — SODIUM CHLORIDE 0.9 % IV SOLN
INTRAVENOUS | Status: DC
  Administered 2023-01-12: 1000 mL via INTRAVENOUS

## 2023-01-12 MED ORDER — LIDOCAINE HCL (PF) 2 % IJ SOLN
INTRAMUSCULAR | Status: AC
  Filled 2023-01-12: qty 5

## 2023-01-12 MED ORDER — DEXMEDETOMIDINE HCL IN NACL 400 MCG/100ML IV SOLN
INTRAVENOUS | Status: DC | PRN
  Administered 2023-01-12: 8 ug via INTRAVENOUS
  Administered 2023-01-12: 12 ug via INTRAVENOUS

## 2023-01-12 MED ORDER — DEXMEDETOMIDINE HCL IN NACL 80 MCG/20ML IV SOLN
INTRAVENOUS | Status: AC
  Filled 2023-01-12: qty 20

## 2023-01-12 NOTE — H&P (Signed)
Pre-Procedure H&P   Patient ID: Michael Patterson is a 62 y.o. male.  Gastroenterology Provider: Jaynie Collins, DO  Referring Provider: Tawni Pummel, PA PCP: Barbette Reichmann, MD  Date: 01/12/2023  HPI Mr. Michael Patterson is a 62 y.o. male who presents today for Esophagogastroduodenoscopy and Colonoscopy for Dysphagia, weight loss, diarrhea, personal history of colon polyps .  Patient reports mid sternal sticking with solids.  Denies any issues with liquids pills or odynophagia.  Appetite is good, however, he has lost approximately 10 pounds over the last year despite this Reports he drinks 3 to 6  forty ounce beers per day.  Daily tobacco use.  Has noted diarrhea with several loose stools per day.  This is worse with increased alcohol intake.  Denies nocturnal awakenings melena and hematochezia.  Creatinine 0.6 hemoglobin 15 MCV 96 platelets 227,000  Last EGD and colonoscopy in February 2015 demonstrating 3 total polyps including adenomatous and sessile serrated polyps.  Diverticulosis also appreciated.  Esophagitis, gastritis, and duodenitis was also noted positive for H. pylori on biopsy L hip plate and pins   Past Medical History:  Diagnosis Date   Alcohol abuse    Arthritis    GERD (gastroesophageal reflux disease)    Hypertension     Past Surgical History:  Procedure Laterality Date   BACK SURGERY     HIP FRACTURE SURGERY     SKIN GRAFT Left hand    Family History No h/o GI disease or malignancy  Review of Systems  Constitutional:  Positive for unexpected weight change. Negative for activity change, appetite change, chills, diaphoresis, fatigue and fever.  HENT:  Positive for trouble swallowing. Negative for voice change.   Respiratory:  Negative for shortness of breath and wheezing.   Cardiovascular:  Negative for chest pain, palpitations and leg swelling.  Gastrointestinal:  Positive for diarrhea. Negative for abdominal distention, abdominal pain,  anal bleeding, blood in stool, constipation, nausea and vomiting.  Musculoskeletal:  Negative for arthralgias and myalgias.  Skin:  Negative for color change and pallor.  Neurological:  Negative for dizziness, syncope and weakness.  Psychiatric/Behavioral:  Negative for confusion. The patient is not nervous/anxious.   All other systems reviewed and are negative.    Medications No current facility-administered medications on file prior to encounter.   Current Outpatient Medications on File Prior to Encounter  Medication Sig Dispense Refill   acetaminophen (TYLENOL) 325 MG tablet Take 325-650 mg by mouth every 6 (six) hours as needed for moderate pain. Reported on 10/05/2015     atorvastatin (LIPITOR) 10 MG tablet Take by mouth.     docusate sodium (STOOL SOFTENER) 100 MG capsule Take 100 mg by mouth daily. Reported on 10/05/2015     HYDROcodone-acetaminophen (NORCO/VICODIN) 5-325 MG tablet Take 1 tablet by mouth daily as needed.     hydrOXYzine (ATARAX/VISTARIL) 25 MG tablet Take 1 tablet (25 mg total) by mouth 3 (three) times daily as needed for anxiety. 90 tablet 0   losartan-hydrochlorothiazide (HYZAAR) 50-12.5 MG tablet Take 1 tablet by mouth daily. 30 tablet 0   naproxen (NAPROSYN) 500 MG tablet Take 1 tablet (500 mg total) by mouth 2 (two) times daily with a meal. 20 tablet 0   ranitidine (ZANTAC) 150 MG capsule Take 150 mg by mouth daily. Reported on 10/05/2015     sertraline (ZOLOFT) 100 MG tablet Take 100 mg by mouth daily. Reported on 10/05/2015     tamsulosin (FLOMAX) 0.4 MG CAPS capsule Take 1 capsule (0.4  mg total) by mouth daily. 30 capsule 11   Vitamin D, Ergocalciferol, (DRISDOL) 50000 units CAPS capsule Take 50,000 Units by mouth every 7 (seven) days. Reported on 10/05/2015      Pertinent medications related to GI and procedure were reviewed by me with the patient prior to the procedure   Current Facility-Administered Medications:    0.9 %  sodium chloride infusion, , Intravenous,  Continuous, Jaynie Collins, DO  sodium chloride         Allergies  Allergen Reactions   Aspirin     Pt has stomach ulcers and reports he can not take ASA.    Allergies were reviewed by me prior to the procedure  Objective   Body mass index is 19.96 kg/m. Vitals:   01/12/23 1005  BP: 125/78  Pulse: 68  Resp: 18  Temp: (!) 96.5 F (35.8 C)  TempSrc: Temporal  SpO2: 100%  Weight: 68.6 kg  Height: 6\' 1"  (1.854 m)     Physical Exam Vitals and nursing note reviewed.  Constitutional:      General: He is not in acute distress.    Appearance: Normal appearance. He is not ill-appearing, toxic-appearing or diaphoretic.  HENT:     Head: Normocephalic and atraumatic.     Nose: Nose normal.     Mouth/Throat:     Mouth: Mucous membranes are moist.     Pharynx: Oropharynx is clear.  Eyes:     General: No scleral icterus.    Extraocular Movements: Extraocular movements intact.  Cardiovascular:     Rate and Rhythm: Normal rate and regular rhythm.     Heart sounds: Normal heart sounds. No murmur heard.    No friction rub. No gallop.  Pulmonary:     Effort: Pulmonary effort is normal. No respiratory distress.     Breath sounds: Normal breath sounds. No wheezing, rhonchi or rales.  Abdominal:     General: Bowel sounds are normal. There is no distension.     Palpations: Abdomen is soft.     Tenderness: There is no abdominal tenderness. There is no guarding or rebound.  Musculoskeletal:     Cervical back: Neck supple.     Right lower leg: No edema.     Left lower leg: No edema.  Skin:    General: Skin is warm and dry.     Coloration: Skin is not jaundiced or pale.  Neurological:     General: No focal deficit present.     Mental Status: He is alert and oriented to person, place, and time. Mental status is at baseline.  Psychiatric:        Mood and Affect: Mood normal.        Behavior: Behavior normal.        Thought Content: Thought content normal.        Judgment:  Judgment normal.      Assessment:  Mr. Michael Patterson is a 62 y.o. male  who presents today for Esophagogastroduodenoscopy and Colonoscopy for Dysphagia, weight loss, diarrhea, personal history of colon polyps .  Plan:  Esophagogastroduodenoscopy and Colonoscopy with possible intervention today  Esophagogastroduodenoscopy and Colonoscopy with possible biopsy, control of bleeding, polypectomy, and interventions as necessary has been discussed with the patient/patient representative. Informed consent was obtained from the patient/patient representative after explaining the indication, nature, and risks of the procedure including but not limited to death, bleeding, perforation, missed neoplasm/lesions, cardiorespiratory compromise, and reaction to medications. Opportunity for questions was given and appropriate  answers were provided. Patient/patient representative has verbalized understanding is amenable to undergoing the procedure.   Jaynie Collins, DO  [facility]  Portions of the record may have been created with voice recognition software. Occasional wrong-word or 'sound-a-like' substitutions may have occurred due to the inherent limitations of voice recognition software.  Read the chart carefully and recognize, using context, where substitutions may have occurred.

## 2023-01-12 NOTE — Op Note (Signed)
[facility] Patient Name: Michael Patterson Procedure Date: 01/12/2023 10:18 AM MRN: [id number] Account #: [id number] Date of Birth: 02/18/1961 Admit Type: Outpatient Age: 62 Room: [facility] ENDO ROOM 2 Gender: Male Note Status: Finalized Instrument Name: Colonscope [id number] Procedure:             Colonoscopy Indications:           High risk colon cancer surveillance: Personal history                         of colonic polyps Providers:             Jaynie Collins DO, DO Medicines:             Monitored Anesthesia Care Complications:         No immediate complications. Estimated blood loss:                         Minimal. Procedure:             Pre-Anesthesia Assessment:                        - Prior to the procedure, a History and Physical was                         performed, and patient medications and allergies were                         reviewed. The patient is competent. The risks and                         benefits of the procedure and the sedation options and                         risks were discussed with the patient. All questions                         were answered and informed consent was obtained.                         Patient identification and proposed procedure were                         verified by the physician, the nurse, the anesthetist                         and the technician in the endoscopy suite. Mental                         Status Examination: alert and oriented. Airway                         Examination: normal oropharyngeal airway and neck                         mobility. Respiratory Examination: clear to                         auscultation. CV Examination: RRR, no murmurs, no S3  or S4. Prophylactic Antibiotics: The patient does not                         require prophylactic antibiotics. Prior                         Anticoagulants: The patient has taken no anticoagulant                          or antiplatelet agents. ASA Grade Assessment: II - A                         patient with mild systemic disease. After reviewing                         the risks and benefits, the patient was deemed in                         satisfactory condition to undergo the procedure. The                         anesthesia plan was to use monitored anesthesia care                         (MAC). Immediately prior to administration of                         medications, the patient was re-assessed for adequacy                         to receive sedatives. The heart rate, respiratory                         rate, oxygen saturations, blood pressure, adequacy of                         pulmonary ventilation, and response to care were                         monitored throughout the procedure. The physical                         status of the patient was re-assessed after the                         procedure.                        After obtaining informed consent, the colonoscope was                         passed under direct vision. Throughout the procedure,                         the patient's blood pressure, pulse, and oxygen                         saturations were monitored continuously. The  Colonoscope was introduced through the anus and                         advanced to the the cecum, identified by appendiceal                         orifice and ileocecal valve. The colonoscopy was                         performed without difficulty. The patient tolerated                         the procedure well. The quality of the bowel                         preparation was evaluated using the BBPS [facility] Bowel                         Preparation Scale) with scores of: Right Colon = 3,                         Transverse Colon = 3 and Left Colon = 3 (entire mucosa                         seen well with no residual staining, small fragments                          of stool or opaque liquid). The total BBPS score                         equals 9. The ileocecal valve, appendiceal orifice,                         and rectum were photographed. Findings:      The perianal and digital rectal examinations were normal. Pertinent       negatives include normal sphincter tone.      Unable to intubate the TI despite multiple attempts. Estimated blood       loss: none.      Scattered medium-mouthed and small-mouthed diverticula were found in the       entire colon. Estimated blood loss: none.      Non-bleeding internal hemorrhoids were found during retroflexion. The       hemorrhoids were Grade I (internal hemorrhoids that do not prolapse).       Estimated blood loss: none.      Normal mucosa was found in the entire colon. Biopsies for histology were       taken with a cold forceps from the right colon and left colon for       evaluation of microscopic colitis. Estimated blood loss was minimal.      Two pedunculated polyps were found in the sigmoid colon and descending       colon. The polyps were 8 to 12 mm in size. These polyps were removed       with a hot snare. Resection and retrieval were complete. Estimated blood       loss was minimal.      The exam was otherwise without abnormality on direct and retroflexion  views.      Retroflexion in the right colon was performed. Impression:            - Diverticulosis in the entire examined colon.                        - Non-bleeding internal hemorrhoids.                        - Normal mucosa in the entire examined colon. Biopsied.                        - Two 8 to 12 mm polyps in the sigmoid colon and in                         the descending colon, removed with a hot snare.                         Resected and retrieved.                        - The examination was otherwise normal on direct and                         retroflexion views. Recommendation:        - Patient has a contact number  available for                         emergencies. The signs and symptoms of potential                         delayed complications were discussed with the patient.                         Return to normal activities tomorrow. Written                         discharge instructions were provided to the patient.                        - Discharge patient to home.                        - Soft diet today.                        - Continue present medications.                        - No ibuprofen, naproxen, or other non-steroidal                         anti-inflammatory drugs for 5 days after polyp removal.                        - Await pathology results.                        - Repeat colonoscopy for surveillance based on  pathology results.                        - Return to GI office as previously scheduled.                        - The findings and recommendations were discussed with                         the patient's family.                        - The findings and recommendations were discussed with                         the patient. Procedure Code(s):     --- Professional ---                        [phone], Colonoscopy, flexible; with removal of                         tumor(s), polyp(s), or other lesion(s) by snare                         technique                        45380, 59, Colonoscopy, flexible; with biopsy, single                         or multiple Diagnosis Code(s):     --- Professional ---                        Z86.010, Personal history of colonic polyps                        K64.0, First degree hemorrhoids                        D12.5, Benign neoplasm of sigmoid colon                        D12.4, Benign neoplasm of descending colon                        K57.30, Diverticulosis of large intestine without                         perforation or abscess without bleeding CPT copyright 2022 American Medical Association. All rights  reserved. The codes documented in this report are preliminary and upon coder review may  be revised to meet current compliance requirements. Attending Participation:      I personally performed the entire procedure. Elfredia Nevins, DO Jaynie Collins DO, DO 01/12/2023 11:32:23 AM This report has been signed electronically. Number of Addenda: 0 Note Initiated On: 01/12/2023 10:18 AM Scope Withdrawal Time: 0 hours 16 minutes 3 seconds  Total Procedure Duration: 0 hours 25 minutes 11 seconds  Estimated Blood Loss:  Estimated blood loss was minimal.      [facility]

## 2023-01-12 NOTE — Transfer of Care (Signed)
Immediate Anesthesia Transfer of Care Note  Patient: Michael Patterson  Procedure(s) Performed: COLONOSCOPY WITH PROPOFOL ESOPHAGOGASTRODUODENOSCOPY (EGD) WITH PROPOFOL BIOPSY MALONEY DILATION  Patient Location: PACU  Anesthesia Type:General  Level of Consciousness: sedated  Airway & Oxygen Therapy: Patient Spontanous Breathing  Post-op Assessment: Report given to RN and Post -op Vital signs reviewed and stable  Post vital signs: Reviewed and stable  Last Vitals:  Vitals Value Taken Time  BP    Temp    Pulse 57 01/12/23 1136  Resp 13 01/12/23 1136  SpO2 100 % 01/12/23 1136  Vitals shown include unfiled device data.  Last Pain:  Vitals:   01/12/23 1005  TempSrc: Temporal  PainSc: 0-No pain         Complications: No notable events documented.

## 2023-01-12 NOTE — Interval H&P Note (Signed)
History and Physical Interval Note: Preprocedure H&P from 01/12/23  was reviewed and there was no interval change after seeing and examining the patient.  Written consent was obtained from the patient after discussion of risks, benefits, and alternatives. Patient has consented to proceed with Esophagogastroduodenoscopy and Colonoscopy with possible intervention   01/12/2023 10:27 AM  Michael Patterson  has presented today for surgery, with the diagnosis of 790.6 (ICD-9-CM) - R79.89 (ICD-10-CM) - Elevated LFTs 783.21 (ICD-9-CM) - R63.4 (ICD-10-CM) - Weight loss 787.20 (ICD-9-CM) - R13.10 (ICD-10-CM) - Dysphagia, unspecified type 787.91 (ICD-9-CM) - R19.7 (ICD-10-CM) - Diarrhea, unspecified type V12.72 (ICD-9-CM) - Z86.010 (ICD-10-CM) - History of colon polyps.  The various methods of treatment have been discussed with the patient and family. After consideration of risks, benefits and other options for treatment, the patient has consented to  Procedure(s): COLONOSCOPY WITH PROPOFOL (N/A) ESOPHAGOGASTRODUODENOSCOPY (EGD) WITH PROPOFOL (N/A) as a surgical intervention.  The patient's history has been reviewed, patient examined, no change in status, stable for surgery.  I have reviewed the patient's chart and labs.  Questions were answered to the patient's satisfaction.     Jaynie Collins

## 2023-01-12 NOTE — Op Note (Signed)
[facility] Gastroenterology Patient Name: Michael Patterson Procedure Date: 01/12/2023 10:18 AM MRN: [id number] Account #: [id number] Date of Birth: 1960/08/06 Admit Type: Outpatient Age: 62 Room: [facility] ENDO ROOM 2 Gender: Male Note Status: Finalized Instrument Name: Upper Endoscope [id number] Procedure:             Upper GI endoscopy Indications:           Dysphagia Providers:             Jaynie Collins DO, DO Medicines:             Monitored Anesthesia Care Complications:         No immediate complications. Estimated blood loss:                         Minimal. Procedure:             Pre-Anesthesia Assessment:                        - Prior to the procedure, a History and Physical was                         performed, and patient medications and allergies were                         reviewed. The patient is competent. The risks and                         benefits of the procedure and the sedation options and                         risks were discussed with the patient. All questions                         were answered and informed consent was obtained.                         Patient identification and proposed procedure were                         verified by the physician, the nurse, the anesthetist                         and the technician in the endoscopy suite. Mental                         Status Examination: alert and oriented. Airway                         Examination: normal oropharyngeal airway and neck                         mobility. Respiratory Examination: clear to                         auscultation. CV Examination: RRR, no murmurs, no S3                         or S4. Prophylactic Antibiotics: The patient does not  require prophylactic antibiotics. Prior                         Anticoagulants: The patient has taken no anticoagulant                         or antiplatelet agents. ASA Grade Assessment: II - A                          patient with mild systemic disease. After reviewing                         the risks and benefits, the patient was deemed in                         satisfactory condition to undergo the procedure. The                         anesthesia plan was to use monitored anesthesia care                         (MAC). Immediately prior to administration of                         medications, the patient was re-assessed for adequacy                         to receive sedatives. The heart rate, respiratory                         rate, oxygen saturations, blood pressure, adequacy of                         pulmonary ventilation, and response to care were                         monitored throughout the procedure. The physical                         status of the patient was re-assessed after the                         procedure.                        After obtaining informed consent, the endoscope was                         passed under direct vision. Throughout the procedure,                         the patient's blood pressure, pulse, and oxygen                         saturations were monitored continuously. The Endoscope                         was introduced through the mouth, and advanced to the  second part of duodenum. The upper GI endoscopy was                         accomplished without difficulty. The patient tolerated                         the procedure well. Findings:      Segmental moderate inflammation characterized by erosions and erythema,       suspicious for ulcer on posterior bulb was found in the first portion of       the duodenum. Biopsies were taken with a cold forceps for histology.       Estimated blood loss was minimal.      The exam of the duodenum was otherwise normal.      patulous gastric pylorus      Two non-bleeding superficial gastric ulcers with a clean ulcer base       (Forrest Class III) were found in the gastric  antrum. The largest lesion       was 2 mm in largest dimension. Biopsies were taken with a cold forceps       for Helicobacter pylori testing. Estimated blood loss was minimal.      A small hiatal hernia was present. Estimated blood loss: none.      Localized moderate inflammation characterized by erythema was found in       the gastric antrum. Estimated blood loss: none.      The exam of the stomach was otherwise normal.      The Z-line was regular. Estimated blood loss: none.      Esophagogastric landmarks were identified: the gastroesophageal junction       was found at 45 cm from the incisors.      No endoscopic abnormality was evident in the esophagus to explain the       patient's complaint of dysphagia. It was decided, however, to proceed       with dilation of the entire esophagus. The scope was withdrawn. Dilation       was performed with a Maloney dilator with no resistance at 48 Fr. The       dilation site was examined following endoscope reinsertion and showed       mild mucosal disruption. Estimated blood loss was minimal.      The exam of the esophagus was otherwise normal. Impression:            - Duodenitis. Biopsied.                        - Non-bleeding gastric ulcers with a clean ulcer base                         (Forrest Class III). Biopsied.                        - Small hiatal hernia.                        - Gastritis.                        - Z-line regular.                        - Esophagogastric landmarks identified.                        -  No endoscopic esophageal abnormality to explain                         patient's dysphagia. Esophagus dilated. Dilated. Recommendation:        - Patient has a contact number available for                         emergencies. The signs and symptoms of potential                         delayed complications were discussed with the patient.                         Return to normal activities tomorrow. Written                          discharge instructions were provided to the patient.                        - Discharge patient to home.                        - Soft diet today.                        - Continue present medications.                        - Add proton pump inhibitor therapy twice daily                        - No ibuprofen, naproxen, or other non-steroidal                         anti-inflammatory drugs.                        - Await pathology results.                        - Repeat upper endoscopy PRN for retreatment.                        - Return to GI office as previously scheduled.                        - proceed with colonoscopy. see report for further                         recommendations.                        - The findings and recommendations were discussed with                         the patient's family.                        - The findings and recommendations were discussed with  the patient. Procedure Code(s):     --- Professional ---                        [phone], Esophagogastroduodenoscopy, flexible,                         transoral; with biopsy, single or multiple                        43450, Dilation of esophagus, by unguided sound or                         bougie, single or multiple passes Diagnosis Code(s):     --- Professional ---                        K29.80, Duodenitis without bleeding                        K25.9, Gastric ulcer, unspecified as acute or chronic,                         without hemorrhage or perforation                        K44.9, Diaphragmatic hernia without obstruction or                         gangrene                        K29.70, Gastritis, unspecified, without bleeding                        R13.10, Dysphagia, unspecified CPT copyright 2022 [employer]. All rights reserved. The codes documented in this report are preliminary and upon coder review may  be revised to meet current compliance  requirements. Attending Participation:      I personally performed the entire procedure. Elfredia Nevins, DO Jaynie Collins DO, DO 01/12/2023 10:59:08 AM This report has been signed electronically. Number of Addenda: 0 Note Initiated On: 01/12/2023 10:18 AM Estimated Blood Loss:  Estimated blood loss was minimal.      [facility]

## 2023-01-12 NOTE — Group Note (Deleted)

## 2023-01-12 NOTE — Anesthesia Preprocedure Evaluation (Addendum)
Anesthesia Evaluation  Patient identified by MRN, date of birth, ID band Patient awake    Reviewed: Allergy & Precautions, H&P , NPO status , Patient's Chart, lab work & pertinent test results, reviewed documented beta blocker date and time   Airway Mallampati: II   Neck ROM: full    Dental  (+) Poor Dentition   Pulmonary neg pulmonary ROS, Current Smoker   Pulmonary exam normal        Cardiovascular Exercise Tolerance: Good hypertension, Pt. on medications (-) angina (-) CAD, (-) Past MI and (-) Cardiac Stents negative cardio ROS Normal cardiovascular exam(-) dysrhythmias  Rhythm:regular Rate:Normal  Refuses EKG despite R/B review. ja   Neuro/Psych  PSYCHIATRIC DISORDERS  Depression     Neuromuscular disease negative neurological ROS  negative psych ROS   GI/Hepatic negative GI ROS, Neg liver ROS,GERD  Medicated,,  Endo/Other  negative endocrine ROS    Renal/GU negative Renal ROS  negative genitourinary   Musculoskeletal   Abdominal   Peds  Hematology negative hematology ROS (+)   Anesthesia Other Findings Past Medical History: No date: Alcohol abuse No date: Arthritis No date: GERD (gastroesophageal reflux disease) No date: Hypertension Past Surgical History: No date: BACK SURGERY No date: HIP FRACTURE SURGERY hand: SKIN GRAFT; Left BMI    Body Mass Index: 19.96 kg/m     Reproductive/Obstetrics negative OB ROS                             Anesthesia Physical Anesthesia Plan  ASA: 2  Anesthesia Plan: General   Post-op Pain Management:    Induction:   PONV Risk Score and Plan:   Airway Management Planned:   Additional Equipment:   Intra-op Plan:   Post-operative Plan:   Informed Consent: I have reviewed the patients History and Physical, chart, labs and discussed the procedure including the risks, benefits and alternatives for the proposed anesthesia with the  patient or authorized representative who has indicated his/her understanding and acceptance.     Dental Advisory Given  Plan Discussed with: CRNA  Anesthesia Plan Comments:        Anesthesia Quick Evaluation

## 2023-01-13 ENCOUNTER — Encounter: Payer: Self-pay | Admitting: Gastroenterology

## 2023-01-13 NOTE — Anesthesia Postprocedure Evaluation (Signed)
Anesthesia Post Note  Patient: Michael Patterson  Procedure(s) Performed: COLONOSCOPY WITH PROPOFOL ESOPHAGOGASTRODUODENOSCOPY (EGD) WITH PROPOFOL BIOPSY MALONEY DILATION  Patient location during evaluation: PACU Anesthesia Type: General Level of consciousness: awake and alert Pain management: pain level controlled Vital Signs Assessment: post-procedure vital signs reviewed and stable Respiratory status: spontaneous breathing, nonlabored ventilation, respiratory function stable and patient connected to nasal cannula oxygen Cardiovascular status: blood pressure returned to baseline and stable Postop Assessment: no apparent nausea or vomiting Anesthetic complications: no   No notable events documented.   Last Vitals:  Vitals:   01/12/23 1149 01/12/23 1150  BP: (!) 128/95 (!) 128/95  Pulse: 60   Resp: 15 19  Temp:    SpO2:      Last Pain:  Vitals:   01/13/23 0742  TempSrc:   PainSc: 0-No pain                 Yevette Edwards

## 2023-03-24 DIAGNOSIS — F1290 Cannabis use, unspecified, uncomplicated: Secondary | ICD-10-CM | POA: Diagnosis not present

## 2023-03-24 DIAGNOSIS — Z789 Other specified health status: Secondary | ICD-10-CM | POA: Diagnosis not present

## 2023-03-24 DIAGNOSIS — G8929 Other chronic pain: Secondary | ICD-10-CM | POA: Diagnosis not present

## 2023-03-24 DIAGNOSIS — F32A Depression, unspecified: Secondary | ICD-10-CM | POA: Diagnosis not present

## 2023-03-24 DIAGNOSIS — F17210 Nicotine dependence, cigarettes, uncomplicated: Secondary | ICD-10-CM | POA: Diagnosis not present

## 2023-03-24 DIAGNOSIS — M79671 Pain in right foot: Secondary | ICD-10-CM | POA: Diagnosis not present

## 2023-03-24 DIAGNOSIS — I10 Essential (primary) hypertension: Secondary | ICD-10-CM | POA: Diagnosis not present

## 2023-03-24 DIAGNOSIS — M5442 Lumbago with sciatica, left side: Secondary | ICD-10-CM | POA: Diagnosis not present

## 2023-03-24 DIAGNOSIS — R7309 Other abnormal glucose: Secondary | ICD-10-CM | POA: Diagnosis not present

## 2023-03-24 DIAGNOSIS — M79672 Pain in left foot: Secondary | ICD-10-CM | POA: Diagnosis not present

## 2023-03-24 DIAGNOSIS — R278 Other lack of coordination: Secondary | ICD-10-CM | POA: Diagnosis not present

## 2023-03-24 DIAGNOSIS — R7989 Other specified abnormal findings of blood chemistry: Secondary | ICD-10-CM | POA: Diagnosis not present

## 2023-03-24 DIAGNOSIS — R2681 Unsteadiness on feet: Secondary | ICD-10-CM | POA: Diagnosis not present

## 2023-03-31 DIAGNOSIS — H2513 Age-related nuclear cataract, bilateral: Secondary | ICD-10-CM | POA: Diagnosis not present

## 2023-03-31 DIAGNOSIS — M3501 Sicca syndrome with keratoconjunctivitis: Secondary | ICD-10-CM | POA: Diagnosis not present

## 2023-04-17 ENCOUNTER — Other Ambulatory Visit: Payer: Self-pay | Admitting: Internal Medicine

## 2023-04-17 DIAGNOSIS — R7989 Other specified abnormal findings of blood chemistry: Secondary | ICD-10-CM

## 2023-04-22 ENCOUNTER — Ambulatory Visit: Payer: Medicare HMO

## 2023-04-30 ENCOUNTER — Ambulatory Visit
Admission: RE | Admit: 2023-04-30 | Discharge: 2023-04-30 | Disposition: A | Discharge status: Home / Self Care | Payer: Medicare HMO | Source: Ambulatory Visit | Attending: Internal Medicine | Admitting: Internal Medicine

## 2023-04-30 DIAGNOSIS — R7989 Other specified abnormal findings of blood chemistry: Secondary | ICD-10-CM | POA: Diagnosis not present

## 2023-07-15 DIAGNOSIS — Z1331 Encounter for screening for depression: Secondary | ICD-10-CM | POA: Diagnosis not present

## 2023-07-15 DIAGNOSIS — F1090 Alcohol use, unspecified, uncomplicated: Secondary | ICD-10-CM | POA: Diagnosis not present

## 2023-07-15 DIAGNOSIS — R7989 Other specified abnormal findings of blood chemistry: Secondary | ICD-10-CM | POA: Diagnosis not present

## 2023-07-15 DIAGNOSIS — R7309 Other abnormal glucose: Secondary | ICD-10-CM | POA: Diagnosis not present

## 2023-07-15 DIAGNOSIS — R0781 Pleurodynia: Secondary | ICD-10-CM | POA: Diagnosis not present

## 2023-07-15 DIAGNOSIS — Z0000 Encounter for general adult medical examination without abnormal findings: Secondary | ICD-10-CM | POA: Diagnosis not present

## 2023-07-15 DIAGNOSIS — Z9181 History of falling: Secondary | ICD-10-CM | POA: Diagnosis not present

## 2023-07-15 DIAGNOSIS — F3340 Major depressive disorder, recurrent, in remission, unspecified: Secondary | ICD-10-CM | POA: Diagnosis not present

## 2023-07-15 DIAGNOSIS — F17210 Nicotine dependence, cigarettes, uncomplicated: Secondary | ICD-10-CM | POA: Diagnosis not present

## 2023-07-15 DIAGNOSIS — I10 Essential (primary) hypertension: Secondary | ICD-10-CM | POA: Diagnosis not present

## 2023-07-15 DIAGNOSIS — M5442 Lumbago with sciatica, left side: Secondary | ICD-10-CM | POA: Diagnosis not present

## 2023-07-15 DIAGNOSIS — Z87828 Personal history of other (healed) physical injury and trauma: Secondary | ICD-10-CM | POA: Diagnosis not present

## 2023-07-15 DIAGNOSIS — Z125 Encounter for screening for malignant neoplasm of prostate: Secondary | ICD-10-CM | POA: Diagnosis not present

## 2023-07-15 DIAGNOSIS — F1290 Cannabis use, unspecified, uncomplicated: Secondary | ICD-10-CM | POA: Diagnosis not present

## 2023-07-15 DIAGNOSIS — M79604 Pain in right leg: Secondary | ICD-10-CM | POA: Diagnosis not present

## 2023-07-15 DIAGNOSIS — R202 Paresthesia of skin: Secondary | ICD-10-CM | POA: Diagnosis not present

## 2023-07-15 DIAGNOSIS — R109 Unspecified abdominal pain: Secondary | ICD-10-CM | POA: Diagnosis not present

## 2023-07-15 DIAGNOSIS — M79605 Pain in left leg: Secondary | ICD-10-CM | POA: Diagnosis not present

## 2023-07-28 DIAGNOSIS — M79604 Pain in right leg: Secondary | ICD-10-CM | POA: Diagnosis not present

## 2023-07-28 DIAGNOSIS — F17200 Nicotine dependence, unspecified, uncomplicated: Secondary | ICD-10-CM | POA: Diagnosis not present

## 2023-07-28 DIAGNOSIS — M79605 Pain in left leg: Secondary | ICD-10-CM | POA: Diagnosis not present

## 2023-09-23 DIAGNOSIS — H10503 Unspecified blepharoconjunctivitis, bilateral: Secondary | ICD-10-CM | POA: Diagnosis not present

## 2023-11-02 ENCOUNTER — Other Ambulatory Visit: Payer: Self-pay | Admitting: Acute Care

## 2023-11-02 DIAGNOSIS — Z87891 Personal history of nicotine dependence: Secondary | ICD-10-CM

## 2023-11-02 DIAGNOSIS — Z122 Encounter for screening for malignant neoplasm of respiratory organs: Secondary | ICD-10-CM

## 2023-11-02 DIAGNOSIS — F17210 Nicotine dependence, cigarettes, uncomplicated: Secondary | ICD-10-CM

## 2023-11-19 ENCOUNTER — Ambulatory Visit: Admission: RE | Admit: 2023-11-19 | Source: Ambulatory Visit

## 2024-01-21 DIAGNOSIS — J209 Acute bronchitis, unspecified: Secondary | ICD-10-CM | POA: Diagnosis not present

## 2024-01-21 DIAGNOSIS — M79672 Pain in left foot: Secondary | ICD-10-CM | POA: Diagnosis not present

## 2024-01-21 DIAGNOSIS — M79671 Pain in right foot: Secondary | ICD-10-CM | POA: Diagnosis not present

## 2024-01-21 DIAGNOSIS — M5416 Radiculopathy, lumbar region: Secondary | ICD-10-CM | POA: Diagnosis not present

## 2024-01-21 DIAGNOSIS — F1090 Alcohol use, unspecified, uncomplicated: Secondary | ICD-10-CM | POA: Diagnosis not present

## 2024-01-21 DIAGNOSIS — I10 Essential (primary) hypertension: Secondary | ICD-10-CM | POA: Diagnosis not present

## 2024-01-21 DIAGNOSIS — E538 Deficiency of other specified B group vitamins: Secondary | ICD-10-CM | POA: Diagnosis not present

## 2024-01-21 DIAGNOSIS — Z0000 Encounter for general adult medical examination without abnormal findings: Secondary | ICD-10-CM | POA: Diagnosis not present

## 2024-01-21 DIAGNOSIS — F17210 Nicotine dependence, cigarettes, uncomplicated: Secondary | ICD-10-CM | POA: Diagnosis not present

## 2024-01-21 DIAGNOSIS — R7309 Other abnormal glucose: Secondary | ICD-10-CM | POA: Diagnosis not present

## 2024-01-21 DIAGNOSIS — R202 Paresthesia of skin: Secondary | ICD-10-CM | POA: Diagnosis not present

## 2024-01-21 DIAGNOSIS — M79604 Pain in right leg: Secondary | ICD-10-CM | POA: Diagnosis not present

## 2024-01-21 DIAGNOSIS — F1020 Alcohol dependence, uncomplicated: Secondary | ICD-10-CM | POA: Diagnosis not present

## 2024-01-21 DIAGNOSIS — F1290 Cannabis use, unspecified, uncomplicated: Secondary | ICD-10-CM | POA: Diagnosis not present

## 2024-01-21 DIAGNOSIS — G8929 Other chronic pain: Secondary | ICD-10-CM | POA: Diagnosis not present

## 2024-01-21 DIAGNOSIS — R109 Unspecified abdominal pain: Secondary | ICD-10-CM | POA: Diagnosis not present

## 2024-01-21 DIAGNOSIS — F17200 Nicotine dependence, unspecified, uncomplicated: Secondary | ICD-10-CM | POA: Diagnosis not present
